# Patient Record
Sex: Male | Born: 1935 | Hispanic: No | Marital: Married | Smoking: Former smoker
Health system: Southern US, Community
[De-identification: ages and names within clinical notes are randomized; demographics above are authoritative.]

## PROBLEM LIST (undated history)

## (undated) DIAGNOSIS — C4490 Unspecified malignant neoplasm of skin, unspecified: Secondary | ICD-10-CM

## (undated) DIAGNOSIS — K635 Polyp of colon: Secondary | ICD-10-CM

## (undated) DIAGNOSIS — I2510 Atherosclerotic heart disease of native coronary artery without angina pectoris: Secondary | ICD-10-CM

## (undated) DIAGNOSIS — I459 Conduction disorder, unspecified: Secondary | ICD-10-CM

## (undated) DIAGNOSIS — Z95828 Presence of other vascular implants and grafts: Secondary | ICD-10-CM

## (undated) DIAGNOSIS — D649 Anemia, unspecified: Secondary | ICD-10-CM

## (undated) DIAGNOSIS — C182 Malignant neoplasm of ascending colon: Principal | ICD-10-CM

## (undated) DIAGNOSIS — M1990 Unspecified osteoarthritis, unspecified site: Secondary | ICD-10-CM

## (undated) DIAGNOSIS — E785 Hyperlipidemia, unspecified: Secondary | ICD-10-CM

## (undated) DIAGNOSIS — E119 Type 2 diabetes mellitus without complications: Secondary | ICD-10-CM

## (undated) DIAGNOSIS — I255 Ischemic cardiomyopathy: Secondary | ICD-10-CM

## (undated) DIAGNOSIS — I219 Acute myocardial infarction, unspecified: Secondary | ICD-10-CM

## (undated) DIAGNOSIS — I513 Intracardiac thrombosis, not elsewhere classified: Secondary | ICD-10-CM

## (undated) HISTORY — DX: Atherosclerotic heart disease of native coronary artery without angina pectoris: I25.10

## (undated) HISTORY — DX: Malignant neoplasm of ascending colon: C18.2

## (undated) HISTORY — PX: JOINT REPLACEMENT: SHX530

## (undated) HISTORY — DX: Hyperlipidemia, unspecified: E78.5

## (undated) HISTORY — PX: PARTIAL COLECTOMY: SHX5273

## (undated) HISTORY — DX: Polyp of colon: K63.5

## (undated) HISTORY — DX: Unspecified malignant neoplasm of skin, unspecified: C44.90

## (undated) HISTORY — DX: Intracardiac thrombosis, not elsewhere classified: I51.3

## (undated) HISTORY — DX: Ischemic cardiomyopathy: I25.5

## (undated) HISTORY — DX: Conduction disorder, unspecified: I45.9

---

## 2007-08-10 ENCOUNTER — Encounter: Admission: RE | Admit: 2007-08-10 | Discharge: 2007-08-10 | Payer: Self-pay | Admitting: Orthopedic Surgery

## 2008-09-29 ENCOUNTER — Encounter: Admission: RE | Admit: 2008-09-29 | Discharge: 2008-09-29 | Payer: Self-pay | Admitting: Internal Medicine

## 2008-11-17 ENCOUNTER — Encounter: Admission: RE | Admit: 2008-11-17 | Discharge: 2008-12-08 | Payer: Self-pay | Admitting: Internal Medicine

## 2008-12-04 ENCOUNTER — Ambulatory Visit: Payer: Self-pay | Admitting: Cardiothoracic Surgery

## 2008-12-04 ENCOUNTER — Inpatient Hospital Stay (HOSPITAL_COMMUNITY): Admission: EM | Admit: 2008-12-04 | Discharge: 2008-12-13 | Payer: Self-pay | Admitting: Emergency Medicine

## 2008-12-04 HISTORY — PX: CARDIAC CATHETERIZATION: SHX172

## 2008-12-08 ENCOUNTER — Encounter (INDEPENDENT_AMBULATORY_CARE_PROVIDER_SITE_OTHER): Payer: Self-pay | Admitting: Cardiovascular Disease

## 2008-12-16 ENCOUNTER — Inpatient Hospital Stay (HOSPITAL_COMMUNITY): Admission: EM | Admit: 2008-12-16 | Discharge: 2008-12-20 | Payer: Self-pay | Admitting: Emergency Medicine

## 2009-01-13 HISTORY — PX: CARDIOVASCULAR STRESS TEST: SHX262

## 2009-03-25 HISTORY — PX: TRANSTHORACIC ECHOCARDIOGRAM: SHX275

## 2010-02-18 ENCOUNTER — Encounter: Admission: RE | Admit: 2010-02-18 | Discharge: 2010-02-18 | Payer: Self-pay | Admitting: Internal Medicine

## 2010-02-19 ENCOUNTER — Inpatient Hospital Stay (HOSPITAL_COMMUNITY): Admission: EM | Admit: 2010-02-19 | Discharge: 2010-02-20 | Payer: Self-pay | Admitting: Cardiovascular Disease

## 2010-08-26 LAB — COMPREHENSIVE METABOLIC PANEL
Albumin: 3.8 g/dL (ref 3.5–5.2)
Alkaline Phosphatase: 51 U/L (ref 39–117)
BUN: 22 mg/dL (ref 6–23)
BUN: 21 mg/dL (ref 6–23)
CO2: 25 mEq/L (ref 19–32)
Calcium: 9 mg/dL (ref 8.4–10.5)
Calcium: 8.7 mg/dL (ref 8.4–10.5)
Creatinine, Ser: 1.01 mg/dL (ref 0.4–1.5)
GFR calc Af Amer: >60 mL/min (ref >60)
GFR calc non Af Amer: >60 mL/min (ref >60)
Glucose, Bld: 134 mg/dL — ABNORMAL HIGH (ref 70–99)
Potassium: 4.1 mEq/L (ref 3.5–5.1)
Total Bilirubin: 1.4 mg/dL — ABNORMAL HIGH (ref 0.3–1.2)
Total Protein: 6.4 g/dL (ref 6.0–8.3)

## 2010-08-26 LAB — CBC
HCT: 44 % (ref 39.0–52.0)
Hemoglobin: 14.2 g/dL (ref 13.0–17.0)
Hemoglobin: 15 g/dL (ref 13.0–17.0)
MCH: 30.7 pg (ref 26.0–34.0)
MCH: 30.8 pg (ref 26.0–34.0)
MCHC: 33.9 g/dL (ref 30.0–36.0)
MCV: 91.5 fL (ref 78.0–100.0)
MCV: 90.2 fL (ref 78.0–100.0)
MCV: 90.5 fL (ref 78.0–100.0)
Platelets: 102 10*3/uL — ABNORMAL LOW (ref 150–400)
Platelets: 123 10*3/uL — ABNORMAL LOW (ref 150–400)
RBC: 4.88 MIL/uL (ref 4.22–5.81)
RDW: 14 % (ref 11.5–15.5)
WBC: 6.7 10*3/uL (ref 4.0–10.5)
WBC: 8.3 10*3/uL (ref 4.0–10.5)

## 2010-08-26 LAB — URINALYSIS, ROUTINE W REFLEX MICROSCOPIC
Glucose, UA: NEGATIVE mg/dL
Protein, ur: NEGATIVE mg/dL
Specific Gravity, Urine: 1.018 (ref 1.005–1.030)
Urobilinogen, UA: 0.2 mg/dL (ref 0.0–1.0)

## 2010-08-26 LAB — DIFFERENTIAL
Basophils Absolute: 0 10*3/uL (ref 0.0–0.1)
Eosinophils Absolute: 0.2 10*3/uL (ref 0.0–0.7)
Eosinophils Relative: 2 % (ref 0–5)
Lymphocytes Relative: 12 % (ref 12–46)
Lymphocytes Relative: 14 % (ref 12–46)
Lymphs Abs: 0.8 10*3/uL (ref 0.7–4.0)
Lymphs Abs: 1.2 10*3/uL (ref 0.7–4.0)
Monocytes Absolute: 0.7 10*3/uL (ref 0.1–1.0)
Monocytes Relative: 9 % (ref 3–12)
Neutro Abs: 5.7 10*3/uL (ref 1.7–7.7)
Neutrophils Relative %: 81 % — ABNORMAL HIGH (ref 43–77)

## 2010-08-26 LAB — TYPE AND SCREEN: Antibody Screen: NEGATIVE

## 2010-08-26 LAB — PROTIME-INR
INR: 2.31 — ABNORMAL HIGH (ref 0.00–1.49)
Prothrombin Time: 24.6 seconds — ABNORMAL HIGH (ref 11.6–15.2)

## 2010-08-26 LAB — GLUCOSE, CAPILLARY

## 2010-08-26 LAB — BASIC METABOLIC PANEL
Chloride: 108 mEq/L (ref 96–112)
GFR calc Af Amer: >60 mL/min (ref >60)
Potassium: 4.5 mEq/L (ref 3.5–5.1)
Sodium: 138 mEq/L (ref 135–145)

## 2010-08-26 LAB — BRAIN NATRIURETIC PEPTIDE
Pro B Natriuretic peptide (BNP): 271 pg/mL — ABNORMAL HIGH (ref 0.0–100.0)
Pro B Natriuretic peptide (BNP): 315 pg/mL — ABNORMAL HIGH (ref 0.0–100.0)

## 2010-08-26 LAB — POCT CARDIAC MARKERS: CKMB, poc: 2.6 ng/mL (ref 1.0–8.0)

## 2010-09-19 LAB — PROTIME-INR
INR: 1.2 (ref 0.00–1.49)
INR: 2.6 — ABNORMAL HIGH (ref 0.00–1.49)
INR: 2.7 — ABNORMAL HIGH (ref 0.00–1.49)
Prothrombin Time: 18.5 seconds — ABNORMAL HIGH (ref 11.6–15.2)
Prothrombin Time: 25.1 seconds — ABNORMAL HIGH (ref 11.6–15.2)
Prothrombin Time: 30 seconds — ABNORMAL HIGH (ref 11.6–15.2)
Prothrombin Time: 30.3 seconds — ABNORMAL HIGH (ref 11.6–15.2)
Prothrombin Time: 35.5 seconds — ABNORMAL HIGH (ref 11.6–15.2)

## 2010-09-19 LAB — BRAIN NATRIURETIC PEPTIDE
Pro B Natriuretic peptide (BNP): 1003 pg/mL — ABNORMAL HIGH (ref 0.0–100.0)
Pro B Natriuretic peptide (BNP): 1154 pg/mL — ABNORMAL HIGH (ref 0.0–100.0)
Pro B Natriuretic peptide (BNP): 677 pg/mL — ABNORMAL HIGH (ref 0.0–100.0)

## 2010-09-19 LAB — CBC
HCT: 36.5 % — ABNORMAL LOW (ref 39.0–52.0)
HCT: 37.3 % — ABNORMAL LOW (ref 39.0–52.0)
HCT: 38.6 % — ABNORMAL LOW (ref 39.0–52.0)
HCT: 38.9 % — ABNORMAL LOW (ref 39.0–52.0)
Hemoglobin: 12.5 g/dL — ABNORMAL LOW (ref 13.0–17.0)
Hemoglobin: 12.9 g/dL — ABNORMAL LOW (ref 13.0–17.0)
Hemoglobin: 13.3 g/dL (ref 13.0–17.0)
MCHC: 34.2 g/dL (ref 30.0–36.0)
MCHC: 34.2 g/dL (ref 30.0–36.0)
MCHC: 34.3 g/dL (ref 30.0–36.0)
MCV: 90.2 fL (ref 78.0–100.0)
MCV: 90.3 fL (ref 78.0–100.0)
Platelets: 142 10*3/uL — ABNORMAL LOW (ref 150–400)
Platelets: 247 10*3/uL (ref 150–400)
RBC: 4.13 MIL/uL — ABNORMAL LOW (ref 4.22–5.81)
RBC: 4.27 MIL/uL (ref 4.22–5.81)
RBC: 4.33 MIL/uL (ref 4.22–5.81)
RBC: 4.34 MIL/uL (ref 4.22–5.81)
RBC: 4.35 MIL/uL (ref 4.22–5.81)
RDW: 13.7 % (ref 11.5–15.5)
WBC: 8 10*3/uL (ref 4.0–10.5)
WBC: 9 10*3/uL (ref 4.0–10.5)
WBC: 9.3 10*3/uL (ref 4.0–10.5)

## 2010-09-19 LAB — COMPREHENSIVE METABOLIC PANEL
AST: 25 U/L (ref 0–37)
Albumin: 3.2 g/dL — ABNORMAL LOW (ref 3.5–5.2)
Alkaline Phosphatase: 75 U/L (ref 39–117)
Chloride: 105 mEq/L (ref 96–112)
GFR calc Af Amer: >60 mL/min (ref >60)
Potassium: 4.3 mEq/L (ref 3.5–5.1)
Sodium: 138 mEq/L (ref 135–145)
Total Bilirubin: 0.9 mg/dL (ref 0.3–1.2)
Total Protein: 6.7 g/dL (ref 6.0–8.3)

## 2010-09-19 LAB — URINALYSIS, ROUTINE W REFLEX MICROSCOPIC
Glucose, UA: NEGATIVE mg/dL
Ketones, ur: NEGATIVE mg/dL
Nitrite: NEGATIVE
Specific Gravity, Urine: 1.01 (ref 1.005–1.030)
pH: 7 (ref 5.0–8.0)

## 2010-09-19 LAB — CARDIAC PANEL(CRET KIN+CKTOT+MB+TROPI)
CK, MB: 2.6 ng/mL (ref 0.3–4.0)
Relative Index: 2 (ref 0.0–2.5)
Total CK: 130 U/L (ref 7–232)
Total CK: 139 U/L (ref 7–232)
Troponin I: 0.3 ng/mL — ABNORMAL HIGH (ref 0.00–0.06)

## 2010-09-19 LAB — HEPARIN LEVEL (UNFRACTIONATED)
Heparin Unfractionated: 0.43 IU/mL (ref 0.30–0.70)
Heparin Unfractionated: 0.75 IU/mL — ABNORMAL HIGH (ref 0.30–0.70)

## 2010-09-19 LAB — GLUCOSE, CAPILLARY

## 2010-09-19 LAB — BASIC METABOLIC PANEL
BUN: 18 mg/dL (ref 6–23)
BUN: 19 mg/dL (ref 6–23)
BUN: 20 mg/dL (ref 6–23)
BUN: 21 mg/dL (ref 6–23)
BUN: 21 mg/dL (ref 6–23)
CO2: 26 mEq/L (ref 19–32)
CO2: 27 mEq/L (ref 19–32)
CO2: 28 mEq/L (ref 19–32)
CO2: 28 mEq/L (ref 19–32)
Calcium: 8.8 mg/dL (ref 8.4–10.5)
Calcium: 8.9 mg/dL (ref 8.4–10.5)
Calcium: 9 mg/dL (ref 8.4–10.5)
Chloride: 103 mEq/L (ref 96–112)
Chloride: 103 mEq/L (ref 96–112)
Creatinine, Ser: 0.99 mg/dL (ref 0.4–1.5)
Creatinine, Ser: 1.04 mg/dL (ref 0.4–1.5)
Creatinine, Ser: 1.07 mg/dL (ref 0.4–1.5)
Creatinine, Ser: 1.21 mg/dL (ref 0.4–1.5)
GFR calc Af Amer: >60 mL/min (ref >60)
GFR calc Af Amer: >60 mL/min (ref >60)
GFR calc non Af Amer: 58 mL/min — ABNORMAL LOW (ref >60)
GFR calc non Af Amer: >60 mL/min (ref >60)
GFR calc non Af Amer: >60 mL/min (ref >60)
GFR calc non Af Amer: >60 mL/min (ref >60)
Glucose, Bld: 100 mg/dL — ABNORMAL HIGH (ref 70–99)
Glucose, Bld: 105 mg/dL — ABNORMAL HIGH (ref 70–99)
Glucose, Bld: 111 mg/dL — ABNORMAL HIGH (ref 70–99)
Glucose, Bld: 94 mg/dL (ref 70–99)
Potassium: 3.6 mEq/L (ref 3.5–5.1)
Potassium: 3.9 mEq/L (ref 3.5–5.1)
Potassium: 4 mEq/L (ref 3.5–5.1)
Potassium: 4.2 mEq/L (ref 3.5–5.1)
Sodium: 139 mEq/L (ref 135–145)
Sodium: 140 mEq/L (ref 135–145)
Sodium: 142 mEq/L (ref 135–145)

## 2010-09-19 LAB — URINE CULTURE: Colony Count: 10000

## 2010-09-19 LAB — CULTURE, BLOOD (ROUTINE X 2)

## 2010-09-19 LAB — DIFFERENTIAL
Basophils Absolute: 0 10*3/uL (ref 0.0–0.1)
Basophils Relative: 1 % (ref 0–1)
Eosinophils Relative: 4 % (ref 0–5)
Monocytes Absolute: 0.7 10*3/uL (ref 0.1–1.0)
Monocytes Relative: 8 % (ref 3–12)

## 2010-09-19 LAB — APTT: aPTT: 42 seconds — ABNORMAL HIGH (ref 24–37)

## 2010-09-19 LAB — CK TOTAL AND CKMB (NOT AT ARMC): Relative Index: 1.8 (ref 0.0–2.5)

## 2010-09-20 LAB — GLUCOSE, CAPILLARY
Glucose-Capillary: 101 mg/dL — ABNORMAL HIGH (ref 70–99)
Glucose-Capillary: 103 mg/dL — ABNORMAL HIGH (ref 70–99)
Glucose-Capillary: 107 mg/dL — ABNORMAL HIGH (ref 70–99)
Glucose-Capillary: 115 mg/dL — ABNORMAL HIGH (ref 70–99)
Glucose-Capillary: 123 mg/dL — ABNORMAL HIGH (ref 70–99)
Glucose-Capillary: 124 mg/dL — ABNORMAL HIGH (ref 70–99)
Glucose-Capillary: 127 mg/dL — ABNORMAL HIGH (ref 70–99)
Glucose-Capillary: 133 mg/dL — ABNORMAL HIGH (ref 70–99)
Glucose-Capillary: 135 mg/dL — ABNORMAL HIGH (ref 70–99)
Glucose-Capillary: 148 mg/dL — ABNORMAL HIGH (ref 70–99)
Glucose-Capillary: 151 mg/dL — ABNORMAL HIGH (ref 70–99)
Glucose-Capillary: 155 mg/dL — ABNORMAL HIGH (ref 70–99)
Glucose-Capillary: 157 mg/dL — ABNORMAL HIGH (ref 70–99)
Glucose-Capillary: 177 mg/dL — ABNORMAL HIGH (ref 70–99)
Glucose-Capillary: 200 mg/dL — ABNORMAL HIGH (ref 70–99)
Glucose-Capillary: 93 mg/dL (ref 70–99)
Glucose-Capillary: 94 mg/dL (ref 70–99)
Glucose-Capillary: 97 mg/dL (ref 70–99)

## 2010-09-20 LAB — HEPARIN LEVEL (UNFRACTIONATED)
Heparin Unfractionated: 0.12 IU/mL — ABNORMAL LOW (ref 0.30–0.70)
Heparin Unfractionated: 0.3 IU/mL (ref 0.30–0.70)

## 2010-09-20 LAB — BASIC METABOLIC PANEL
BUN: 25 mg/dL — ABNORMAL HIGH (ref 6–23)
BUN: 27 mg/dL — ABNORMAL HIGH (ref 6–23)
BUN: 27 mg/dL — ABNORMAL HIGH (ref 6–23)
BUN: 32 mg/dL — ABNORMAL HIGH (ref 6–23)
CO2: 26 mEq/L (ref 19–32)
Calcium: 8.2 mg/dL — ABNORMAL LOW (ref 8.4–10.5)
Calcium: 8.4 mg/dL (ref 8.4–10.5)
Chloride: 106 mEq/L (ref 96–112)
Chloride: 110 mEq/L (ref 96–112)
Chloride: 110 mEq/L (ref 96–112)
Creatinine, Ser: 0.94 mg/dL (ref 0.4–1.5)
Creatinine, Ser: 0.94 mg/dL (ref 0.4–1.5)
Creatinine, Ser: 1.03 mg/dL (ref 0.4–1.5)
GFR calc Af Amer: >60 mL/min (ref >60)
GFR calc non Af Amer: 56 mL/min — ABNORMAL LOW (ref >60)
GFR calc non Af Amer: >60 mL/min (ref >60)
GFR calc non Af Amer: >60 mL/min (ref >60)
GFR calc non Af Amer: >60 mL/min (ref >60)
Glucose, Bld: 157 mg/dL — ABNORMAL HIGH (ref 70–99)
Glucose, Bld: 179 mg/dL — ABNORMAL HIGH (ref 70–99)
Potassium: 3.8 mEq/L (ref 3.5–5.1)
Sodium: 141 mEq/L (ref 135–145)

## 2010-09-20 LAB — CARDIAC PANEL(CRET KIN+CKTOT+MB+TROPI)
CK, MB: 581.9 ng/mL — ABNORMAL HIGH (ref 0.3–4.0)
Relative Index: 8.6 — ABNORMAL HIGH (ref 0.0–2.5)
Total CK: 5679 U/L — ABNORMAL HIGH (ref 7–232)
Troponin I: >100 ng/mL (ref 0.00–0.06)

## 2010-09-20 LAB — PROTIME-INR: Prothrombin Time: 14.9 seconds (ref 11.6–15.2)

## 2010-09-20 LAB — URINE CULTURE

## 2010-09-20 LAB — CBC
HCT: 41.7 % (ref 39.0–52.0)
HCT: 44.7 % (ref 39.0–52.0)
Hemoglobin: 13.6 g/dL (ref 13.0–17.0)
Hemoglobin: 14.9 g/dL (ref 13.0–17.0)
MCHC: 32.7 g/dL (ref 30.0–36.0)
MCHC: 33.3 g/dL (ref 30.0–36.0)
MCHC: 33.8 g/dL (ref 30.0–36.0)
MCHC: 34.5 g/dL (ref 30.0–36.0)
MCV: 89.9 fL (ref 78.0–100.0)
MCV: 90.7 fL (ref 78.0–100.0)
MCV: 91.3 fL (ref 78.0–100.0)
Platelets: 144 10*3/uL — ABNORMAL LOW (ref 150–400)
Platelets: 145 10*3/uL — ABNORMAL LOW (ref 150–400)
Platelets: 97 10*3/uL — ABNORMAL LOW (ref 150–400)
RBC: 3.96 MIL/uL — ABNORMAL LOW (ref 4.22–5.81)
RBC: 4.05 MIL/uL — ABNORMAL LOW (ref 4.22–5.81)
RBC: 4.1 MIL/uL — ABNORMAL LOW (ref 4.22–5.81)
RBC: 4.93 MIL/uL (ref 4.22–5.81)
RDW: 13.6 % (ref 11.5–15.5)
RDW: 14 % (ref 11.5–15.5)
RDW: 14.1 % (ref 11.5–15.5)
RDW: 14.2 % (ref 11.5–15.5)
WBC: 7.8 10*3/uL (ref 4.0–10.5)
WBC: 8.9 10*3/uL (ref 4.0–10.5)
WBC: 9.4 10*3/uL (ref 4.0–10.5)

## 2010-09-20 LAB — COMPREHENSIVE METABOLIC PANEL
ALT: 72 U/L — ABNORMAL HIGH (ref 0–53)
ALT: 91 U/L — ABNORMAL HIGH (ref 0–53)
AST: 150 U/L — ABNORMAL HIGH (ref 0–37)
Albumin: 3 g/dL — ABNORMAL LOW (ref 3.5–5.2)
Alkaline Phosphatase: 71 U/L (ref 39–117)
BUN: 29 mg/dL — ABNORMAL HIGH (ref 6–23)
CO2: 26 mEq/L (ref 19–32)
Calcium: 8.4 mg/dL (ref 8.4–10.5)
Chloride: 101 mEq/L (ref 96–112)
Chloride: 110 mEq/L (ref 96–112)
Creatinine, Ser: 0.98 mg/dL (ref 0.4–1.5)
GFR calc Af Amer: >60 mL/min (ref >60)
GFR calc non Af Amer: >60 mL/min (ref >60)
Glucose, Bld: 125 mg/dL — ABNORMAL HIGH (ref 70–99)
Glucose, Bld: 156 mg/dL — ABNORMAL HIGH (ref 70–99)
Potassium: 3.9 mEq/L (ref 3.5–5.1)
Sodium: 137 mEq/L (ref 135–145)
Sodium: 141 mEq/L (ref 135–145)
Total Bilirubin: 1.1 mg/dL (ref 0.3–1.2)
Total Bilirubin: 1.7 mg/dL — ABNORMAL HIGH (ref 0.3–1.2)
Total Protein: 5.6 g/dL — ABNORMAL LOW (ref 6.0–8.3)
Total Protein: 6.3 g/dL (ref 6.0–8.3)

## 2010-09-20 LAB — TSH: TSH: 1.145 u[IU]/mL (ref 0.350–4.500)

## 2010-09-20 LAB — CK TOTAL AND CKMB (NOT AT ARMC)
CK, MB: 267.6 ng/mL — ABNORMAL HIGH (ref 0.3–4.0)
Relative Index: 4.9 — ABNORMAL HIGH (ref 0.0–2.5)
Relative Index: 5.5 — ABNORMAL HIGH (ref 0.0–2.5)

## 2010-09-20 LAB — URINALYSIS, MICROSCOPIC ONLY
Bilirubin Urine: NEGATIVE
Hgb urine dipstick: NEGATIVE
Nitrite: NEGATIVE
Specific Gravity, Urine: 1.012 (ref 1.005–1.030)
pH: 6 (ref 5.0–8.0)

## 2010-09-20 LAB — MAGNESIUM
Magnesium: 2.1 mg/dL (ref 1.5–2.5)
Magnesium: 2.2 mg/dL (ref 1.5–2.5)

## 2010-09-20 LAB — DIFFERENTIAL
Basophils Relative: 0 % (ref 0–1)
Eosinophils Absolute: 0.3 10*3/uL (ref 0.0–0.7)
Eosinophils Relative: 4 % (ref 0–5)
Monocytes Absolute: 0.5 10*3/uL (ref 0.1–1.0)
Monocytes Relative: 8 % (ref 3–12)
Neutro Abs: 4 10*3/uL (ref 1.7–7.7)

## 2010-09-20 LAB — POCT I-STAT, CHEM 8
BUN: 32 mg/dL — ABNORMAL HIGH (ref 6–23)
Calcium, Ion: 1.12 mmol/L (ref 1.12–1.32)
Chloride: 112 mEq/L (ref 96–112)
Creatinine, Ser: 1.1 mg/dL (ref 0.4–1.5)

## 2010-09-20 LAB — POCT CARDIAC MARKERS: Troponin i, poc: <0.05 ng/mL (ref 0.00–0.09)

## 2010-09-20 LAB — APTT: aPTT: 152 seconds — ABNORMAL HIGH (ref 24–37)

## 2010-09-20 LAB — LIPID PANEL
Cholesterol: 224 mg/dL — ABNORMAL HIGH (ref 0–200)
Total CHOL/HDL Ratio: 5.9 RATIO
VLDL: 18 mg/dL (ref 0–40)

## 2010-09-20 LAB — BRAIN NATRIURETIC PEPTIDE: Pro B Natriuretic peptide (BNP): 395 pg/mL — ABNORMAL HIGH (ref 0.0–100.0)

## 2010-10-26 NOTE — H&P (Signed)
NAME:  Blake Patterson, Blake Patterson NO.:  [id number]   MEDICAL RECORD NO.:  [id number]          PATIENT TYPE:  INP   LOCATION:  2905                         FACILITY:  [facility]   PHYSICIAN:  Cristy Hilts. Jacinto Halim, MD       DATE OF BIRTH:  December 31, 1926   DATE OF ADMISSION:  12/04/2008  DATE OF DISCHARGE:                              HISTORY & PHYSICAL   CHIEF COMPLAINT:  Chest pain.   HISTORY OF PRESENT ILLNESS:  An 77 year old white male, very active,  does not appear to be his stated age of 73. developed chest heaviness on  the afternoon of December 04, 2008, and it resolved spontaneously.  The pain  reoccurred at 6:00 p.m. on December 04, 2008.  He also had an episode of  syncope, a tightness-type pain.  EMS was called.  They gave him  nitroglycerin that gave him partial relief.  The pain did not radiate.  It was associated with diaphoresis and nausea.  On his EKG, he had acute  ST elevation in leads V1-V6 and AVL with reciprocal ST depression.  He  was taken emergently to the cardiac catheterization lab, still with  chest pain, diaphoretic and pale.   PAST MEDICAL HISTORY:  History of back pain, degenerative joint disease.  No past surgical history.  No history of coronary disease or diabetes.   OUTPATIENT MEDICATIONS:  Only consisted of vitamin D, vitamin E and  Aleve - he takes 2 daily for his arthritis.   ALLERGIES:  No known allergies.   SOCIAL HISTORY:  He is married and active.  Does not use tobacco.  No  alcohol use.   FAMILY HISTORY:  Not significant with patient's age and current status.   REVIEW OF SYSTEMS:  Had been in usual state of health.  GENERAL:  No  colds or fevers.  SKIN:  No rashes.  HEENT:  No complaints.  CARDIOVASCULAR:  No chest pain until June 24.  GASTROINTESTINAL:  No  diarrhea, constipation or melena.  GENITOURINARY:  Negative.  MUSCULOSKELETAL:  Positive for back pain and arthritic pain.  NEUROLOGICAL:  Syncope prior to the EMS arrival but none prior.  ENDOCRINE:  No diabetes or thyroid disease.   PHYSICAL EXAMINATION:  Blood pressure on arrival to the ER 89/53, pulse  51, respiratory rate 18, temperature 97.6.  Oxygen saturation with  oxygen at 2 liters 99%.  On exam, awake and oriented but anxious and diaphoretic.  SKIN:  Diaphoretic.  NECK:  Supple.  No JVD, no bruits.  LUNGS:  Faint crackles bilaterally.  HEART:  Sounds S1-S2 distant.  No gallop or murmur.  HEENT:  Normocephalic.  Sclera clear.  ABDOMEN:  Soft, nontender, positive bowel sounds.  LOWER EXTREMITIES:  Without edema.  Pedal pulses are present but weak.   IMPRESSION:  1. Acute anterior wall myocardial infarction with ST elevation.  2. Cardiogenic shock with pulmonary edema.  3. Hypotension.  4. Bradycardia.   PLAN:  He was taking emergently to the cardiac catheterization lab,  undergoing cardiac catheterization and found to have significant LAD  disease of 100% as well  as 80% of the circumflex.  Once catheterization  is completed, the patient will proceed to the CCU unit.      Darcella Gasman. [place], N.P.      Cristy Hilts. Jacinto Halim, MD     LRI/MEDQ  D:  12/05/2008  T:  12/05/2008  Job:  [id number]   cc:   Cristy Hilts. Jacinto Halim, MD  Massie Maroon, MD

## 2010-10-26 NOTE — H&P (Signed)
NAME:  Blake Patterson, Blake Patterson NO.:  [id number]   MEDICAL RECORD NO.:  [id number]          PATIENT TYPE:  INP   LOCATION:  1825                         FACILITY:  [facility]   PHYSICIAN:  Nanetta Batty, M.D.   DATE OF BIRTH:  1926/09/29   DATE OF ADMISSION:  12/16/2008  DATE OF DISCHARGE:                              HISTORY & PHYSICAL   CHIEF COMPLAINT:  Chest pain, productive cough, low blood pressure.   HISTORY OF PRESENT ILLNESS:  An 77 year old white married male just  discharged from [facility] on December 13, 2008, after having a large  anterior wall MI on December 04, 2008, undergoing PTCA and stent to the LAD  that was 100% occluded.  The patient was in cardiogenic shock and went  on balloon pump.  After slow progression, the patient recovered and by  December 13, 2008, was ready for discharge.  At time of discharge, he had  mild lower extremity edema.  He was continued on Lasix twice a day for 2  days then stopped.  He had no chest pain and was quite anxious to go  home and Home Healthcare was to follow-up.  Today, Home Healthcare did  come to see the patient.  They called our office, his blood pressure was  90 and he had an episode of chest pain this morning, relieved with  nitroglycerin, but then he stated the pain was gone but the pressure  continued.  Because of these issues, his continued worsening lower  extremity edema and his blood pressure of 90 systolic, he was asked to  come to the emergency room for further evaluation.  Here in the  emergency room, he does admit to chest pain the day prior to admission  as well, again relieved with nitroglycerin.  Today, it was somewhat  worse.  He also had vomiting this morning prior to his chest pain.  His  wife relates he is very anxious and that he is up walking around  frequently, cannot sit still.  The patient was very active prior to his  MI in June.  Also, he does have a productive cough; he is not sure what  color, but  that has started in the last 24 hours.   PAST MEDICAL HISTORY:  Essentially he was a healthy 77 year old  gentleman until he had his anterior MI, except for back pain,  degenerative joint disease.  He never had any surgical history and no  coronary history or diabetes prior to the recent admission.  He had been  on no previous outpatient meds except for nonsteroidals and vitamins.   CURRENT MEDICATIONS:  1. Inspra 12.5 mg daily.  The patient has not taken this as an      outpatient as his pharmacy did not have.  2. Effient 5 mg 1 daily.  He has taken 10 mg daily as the pharmacy did      not have 5 mg tablet and we only had samples of 10 mg tablets, so      he has taken 10 mg daily.  3. Baby aspirin 81 mg two  daily.  4. Crestor 20 mg daily.  5. Pepcid 40 mg daily.  6. Lisinopril 5 mg daily.  7. Coreg 3.125 mg twice a day.  8. Stool softener, Colace over-the-counter for his bowels and MiraLax      as needed.  9. Lasix 20 mg twice a day for 2 days and then he was to go to just 20      mg daily.  10.K-Dur 10 mEq 1 daily for 2 days, then 1/2 tab daily.  11.Coumadin 5 mg tablets.  He did not take any on December 13, 2008,      because his INR was elevated.  Per pharmacy, he was to take 5 mg on      December 14, 2008 and 5 mg on December 15, 2008, and he had an INR checked      today that he was told was therapeutic.  12.Nitroglycerin 1/150 as needed for chest pain.  13.Vitamin D as before.  He was instructed not to take Aleve,      ibuprofen or vitamin E on his numerous medications.   FAMILY HISTORY:  Not significant to the patient's age and current  status.   SOCIAL HISTORY:  He is married and has several children or at least his  son is here today.  He was very active prior to his MI.  No tobacco use.  No alcohol use.   ALLERGIES:  NO KNOWN ALLERGIES.   REVIEW OF SYSTEMS:  GENERAL:  Somewhat anxious, just cannot sit still  very long, up and moving too frequently per his wife.  GI:  Some  diarrhea yesterday.  Today, he feels more constipated.  GU:  No hematuria or dysuria.  NEURO:  No syncope.  MUSCULOSKELETAL:  Lower extremity edema.  PULMONARY:  No shortness of breath.  CARDIOVASCULAR:  As stated above.  ENDOCRINE:  No diabetes or thyroid disease.   PHYSICAL EXAMINATION:  VITAL SIGNS:  Today, blood pressure 109/63, pulse  60, respirations 24, temperature 100.  Oxygen saturation on room air  96%.  GENERAL:  Alert and oriented, somewhat slow to answer questions at  times, but pleasant affect.  HEENT:  Normocephalic.  Sclerae clear.  NECK:  Supple.  No JVD, no bruits.  LUNGS:  Clear without rales, rhonchi or wheezes.  HEART:  S1-S2.  Regular rate and rhythm without murmur, gallop, rub or  click.  ABDOMEN:  Soft, nontender.  Positive bowel sounds x4.  I do not palpate  liver, spleen or masses.  LOWER EXTREMITIES:  He has 2+ at his ankle.  Above there, maybe 1+ to trace edema.  SKIN:  Warm and dry.  Brisk capillary refill.  He had multiple areas of  ecchymosis secondary to IV sticks and lab draws during hospitalization.  NEURO:  Alert and oriented x3.  Follows commands easily.  Moves all  extremities.   LABORATORY DATA:  Pending.   X-RAYS:  Pending.  Also, a KUB was ordered secondary to constipation  versus diarrhea, questionable impaction.   IMPRESSION:  1. Unstable angina, rule out cardiac versus gastrointestinal origin.  2. Fever with productive cough.  3. Diarrhea/constipation.  4. Borderline blood pressure.  5. Anxiety.  6. Known coronary artery disease with recent acute anterior myocardial      infarction, stent to the left anterior descending that was 100%      occluded and cardiogenic shock with intraaortic balloon pump.   PLAN:  Dr. Allyson Sabal saw him and assessed him with me.  Due to  the patient's  age and severity of his recent illness, we will admit him at least  overnight to evaluate, place him on a sleeping pill which he requested,  check urine  cultures, blood cultures, as well as chest x-ray and KUB of  his abdomen.  Add Ativan to his medical regimen and Avelox  once his cultures are done.  Continue his Coumadin for now unless other  problems arise.  His EKG looks as if limb reversal was done in that he  has Q-wave in lead I that was not there previously and deep T-wave.  Otherwise EKG is essentially unchanged.  He is somewhat bradycardic in  the 50s.      Darcella Gasman. Annie Paras, N.P.      Nanetta Batty, M.D.  Electronically Signed    LRI/MEDQ  D:  12/16/2008  T:  12/16/2008  Job:  [id number]   cc:   Cristy Hilts. Jacinto Halim, MD  Massie Maroon, MD  Nanetta Batty, M.D.

## 2010-10-26 NOTE — Cardiovascular Report (Signed)
NAME:  Blake Patterson, Blake Patterson NO.:  [id number]   MEDICAL RECORD NO.:  [id number]          PATIENT TYPE:  INP   LOCATION:  2905                         FACILITY:  [facility]   PHYSICIAN:  Cristy Hilts. Jacinto Halim, MD       DATE OF BIRTH:  [birth year]   DATE OF PROCEDURE:  12/04/2008  DATE OF DISCHARGE:                            CARDIAC CATHETERIZATION   PROCEDURE PERFORMED:  Emergent left heart catheterization and  angioplasty to the proximal and mid segment of the LAD, and balloon  angioplasty to the diagonal branch of the LAD for acute extensive  anterolateral wall myocardial infarction and near cardiogenic shock.   HISTORY:  Blake Patterson is an 77 year old relatively healthy gentleman  with no known prior medical illnesses who had an episode of chest pain  that resolved spontaneously this afternoon and again had recurrence of  chest pain at around 6 o'clock and had an episode of syncope where EMS  was activated.  He eventually arrived at [facility] Emergency Room  around 6:47 p.m. and STEMI was activated showing extensive anterolateral  wall myocardial infarction with ST elevation in I, aVL, V1-V6 with ST  depression in II, III, aVF.   HEMODYNAMIC DATA:  The end-diastolic pressure of the left ventricle was  38 mmHg.   ANGIOGRAPHIC DATA:  Left ventricle:  The ventricle revealed an ejection  fraction of 25% with proximal mid and mid-to-distal, anterolateral,  apical and inferoapical akinesis.  No significant mitral regurgitation.   Right coronary:  Right coronary is a large-caliber vessel and a dominant  vessel with a mid 30% stenoses.   Left main coronary artery:  Left main coronary artery is a large-caliber  vessel.  Distal left main has a 20-30% stenosis.   LAD:  LAD is flush occlude in the proximal segment.  It is a very large  caliber vessel giving origin to a large diagonal 1.  There is mild  diffuse luminal irregularity in the LAD mild diffuse scattered luminal  irregularity.   Ramus intermediate:  Ramus intermediate is a moderate-caliber vessel,  smooth with ostial 10-20% stenosis.   Circumflex coronary artery:  Circumflex coronary artery is a moderate-to-  large caliber vessel with a mid 80% stenoses.   INTERVENTION DATA:  Successful PTCA and stenting of the proximal LAD  with implantation of a long non-drug-eluting 3.5 x 38-mm ZETA stent  deployed at 8 atmospheric pressure.  Following deployment of the stent  there was an edge dissection distally which was covered with a 3.0 x 12-  mm VISION deployed at 10 atmospheric pressure and the same stent balloon  was gently pulled between the two stent struts, and a 16 atmospheric  pressure balloon inflation was performed.   The diagonal branch was also rescued which was completely occluded prior  to balloon angioplasty.  A 2.5 x 12-mm Sprinter [place] balloon was utilized  and balloon angioplasty was performed at 10 atmospheric pressure.  The  stenosis was reduced from 100% to less than 30% with mild ostial  haziness with brisk TIMI 3 flow.   The proximal segment of the LAD  stent was postdilated at high pressure  with a 3.5 x 12 mm [place] Quantum balloon at 16 atmospheric pressure for  about 30 seconds.  Overall stenosis was reduced from 100% occlusion to  0% with brisk TIMI 0 to TIMI 3 flow at the end of the procedure.  TIMI 3  flow was also established into the diagonal 1 branch of the LAD.   RECOMMENDATIONS:  Given extensive anterolateral wall myocardial  infarction and a markedly elevated left ventricle end-diastolic  pressure, intra-aortic balloon pump was introduced through the femoral  arterial access site.   Patient tolerated the procedure well.  At the end of the procedure, the  patient was completely asymptomatic.  The EKG had completely normalized  after balloon angioplasty.  The patient did have episodes of  idioventricular rhythm without any hemodynamic compromise.   RECOMMENDATIONS:  He  has been loaded on ReoPro which we continued for 12  hours and a faint load of 60 mg and then 5 mg once a day along with  aspirin.  Given his severe LV systolic dysfunction, Inspra 25 mg once a  day, Coreg 3.125 mg p.o. b.i.d. and lisinopril 5 mg p.o. daily will be  started.  His BMP will be closely followed.   TECHNIQUE OF THE PROCEDURE:  Under usual sterile precautions using a 6-  French right femoral arterial access and a 5-French right femoral venous  access, a 6-0 multipurpose B2 catheter was advanced into the ascending  aorta and left ventriculography was performed in the RAO projection.  Catheter pulled into the ascending aorta.  Right coronary was  selectively engaged, and angiography was performed, then left main  coronary was selectively engaged and angiography was performed.   Using ReoPro and heparin for anticoagulation maintaining ACT greater  than 200, an [employer] wire was advanced.  Initially, I thought I  was in the LAD and a 2.5 mm x 12-mm [place] balloon was dilated into the  ostium of the ramus intermediate.  The LAD was flush occluded.  Following the angioplasty, I was able to see the ostium of the LAD.  Then I advanced a second wire that is a Cougar wire into the LAD.  The  wire prolapsed into the diagonal branch and I used a 3.0 x 20-mm apex  balloon for performing balloon angioplasty at around 10-12 atmospheric  pressures for 30-40 seconds and angiography was repeated.  Intracoronary  adenosine were administered at multiple episodes.  Intracoronary  nitroglycerin was also administered.  Having performed this, I did  obtain a CVTS consult on emergent basis, Dr. Sheliah Plane, was  present in-house, graciously reviewed the angiogram and given his  advanced age and markedly elevated EDP and markedly depressed ejection  fraction, we opted to proceed with percutaneous revascularization.   We stented the entire lesion length with a 3.5 x 38-mm ZETA stent   deployed at 8 atmospheric pressure and edge dissection was covered with  a 3.0 x 12-mm VISION at around 10 atmospheric pressure and the same  stent balloon was utilized, gently pulling it inside the stent to around  16 atmospheric pressure.  Having performed this then, I went up to the  diagonal branch and the ramus intermediate branch, wire was withdrawn  and the [place] wire was advanced in the diagonal 1 branch of the LAD.  The diagonal branch of the LAD was always protected by having the wire  across this, even during stenting and I was able to easily take the wire  out through the stent struts and reintroduced back into the diagonal  branch.  A 2.5 x 12-mm Sprinter [place] balloon was utilized to perform  multiple balloon angioplasty to the ostium of the diagonal 1 branch of  the LAD and I established TIMI 3 flow.  Having performed this, the wires  were withdrawn and angiography was repeated.  Excellent results were  noted.   I then pulled guide catheter out of the body over a J-wire and inserted  a intra-aortic balloon pump.  The venous sheath and arterial sheaths  were sutured in place and the patient was transferred to CCU in a stable  condition.      Cristy Hilts. Jacinto Halim, MD     JRG/MEDQ  D:  12/04/2008  T:  12/05/2008  Job:  [id number]   cc:   Massie Maroon, MD

## 2010-10-26 NOTE — Discharge Summary (Signed)
NAME:  Blake Patterson, CUEVA NO.:  [id number]   MEDICAL RECORD NO.:  [id number]          PATIENT TYPE:  INP   LOCATION:  2027                         FACILITY:  [facility]   PHYSICIAN:  Cristy Hilts. Jacinto Halim, MD       DATE OF BIRTH:  10-31-1926   DATE OF ADMISSION:  12/04/2008  DATE OF DISCHARGE:  12/13/2008                               DISCHARGE SUMMARY   DISCHARGE DIAGNOSIS:  1. ST elevation myocardial infarction, anterior wall.      a.     Emergent cardiac catheterization with PTCA and stent       deployment to his LAD.  Percutaneous intervention of the diagonal       one angioplasty reducing 100% stenosis to 30.  2. Cardiogenic shock requiring inner aortic balloon pump now resolved.  3. Ischemic cardiomyopathy with ejection fraction 25% by cardiac      catheterization.      a.     A 2-D echocardiogram later in the hospitalization, ejection       fraction 40%.  4. Residual coronary disease of the circumflex of 80%, 30% right      coronary artery stenosis, and 20-30% left main stenosis.  5. Nonsustained ventricular tachycardia, post stenting.  Amiodarone      was used, and then we were able to stop.  6. Fever with clear urinalysis and clear chest x-ray.  7. Hypokalemia, replaced several times.  8. Some confusion post initial acute illness, cleared by discharge.  9. Debilitation.  Worked with cardiac rehab and physical therapy with      resolution of severe dysmobility but still weak at discharge.  10.Constipation, resolved at discharge.  11.Left ventricular thrombus by 2-D echocardiogram with      heparin/Coumadin crossover.  12.Congestive heart failure, improved during hospitalization.  At time      of discharge, BNP 847, which was decreased.  13.Anticoagulation with INR 2.1 at discharge.   DISCHARGE CONDITION:  Much improved.   DISCHARGE MEDICATIONS:  1. __________ 5 mg 1 daily.  Do not stop.  For stent.  2. Baby aspirin 81 mg 2 daily, for stent.  3. Crestor 20 gm 1  daily.  4. Pepcid 40 mg 1 daily.  5. Lisinopril 5 mg daily.  6. Coreg 3.125 mg twice a day.  7. Colace for bowels, over-the-counter, and you can use MiraLax in      liquid daily as needed also.  8. Furosemide 20 mg in a.m. and at suppertime for 2 days, then just in      the morning.  9. K-Dur 10 mEq 1 daily for 2 days, then half a tab daily to equal 5      mg daily.  10.Inspra 12.5 mg daily.  11.Coumadin 5 mg, none on December 13, 2008, 5 mg on December 14, 2008, and 5 mg      on 12/15/08.  Then your Coumadin level will be checked on December 16, 2008.  12.Nitroglycerin 1/150 one sublingual as needed for chest pain.  13.Vitamin D as before.  14.Do not take  Aleve, ibuprofen, or vitamin E.   DISCHARGE INSTRUCTIONS:  1. A low sodium, heart-healthy diet.  2. Wash cath site with soap and water.  Call if any bleeding, swelling      or drainage.  3. May shower with help.  4. No lifting for 4 weeks.  5. No driving for 4 weeks.  6. Increase activity slowly.  7. Call for any problems.  8. Follow up with Dr. Jacinto Halim in 4 weeks.  The office will call with      date and time.  9. Anticoagulation:  Home health was to draw the blood and send to Dr.      Verl Dicker office.   HISTORY OF PRESENT ILLNESS:  An 77 year old white male was admitted  emergently December 04, 2008 with ST elevation MI.  A very active 82-year-  old, he developed chest pain that was described as chest heaviness on  the afternoon of December 04, 2008, resolved spontaneously.  The pain  reoccurred at 6:00 p.m. on June 24.  He had an episode of syncope and  tightness-type pain.  EMS was called. Nitroglycerin gave him partial  relief.  The pain did not radiate.  It was associated with diaphoresis  and nausea.  He had acute ST elevations in leads V1-V6 and AVL,  reciprocal ST depression.  He was taken emergently to the cardiac cath  lab still with chest pain, diaphoretic, and pale.   PAST MEDICAL HISTORY:  As previously stated, with back pain,   degenerative joint disease.   PAST SURGICAL HISTORY:  No history of coronary disease or diabetes.   Outpatient meds were only vitamin D, vitamin E, and Aleve.   ALLERGIES:  No known allergies.   For family history, social history, review of system, see H&P.   PHYSICAL EXAMINATION ON DISCHARGE:  Blood pressure at 93, 104 and 60s,  pulse 70s, respirations 20, temperature afebrile, oxygen saturation room  air and 92-99.  He was in no acute distress.  LUNGS:  Clear to auscultation bilaterally.  Cardiovascular: Regular rate and rhythm without murmur.  ABDOMEN:  Soft, nontender.  EXTREMITIES:  with 1+ edema at the ankles.   LABORATORY VALUES:  Initial cardiac markers:  Myoglobin 224.  Troponin I  less than 0.05, and CK-MB was 3.3.  Shortly thereafter, CK-MB's and  troponins were repeated.  CK was 5423.  MB 267. Follow-up CK-MB:  CK  5679, MB 581 and relative index 10 with a troponin of greater than 100.  The third CK-MB cardiac marker was CK 3229, MB of 276, and troponin  greater than 100.  On June 26, CK was down to 2267 with an MB of 124.   BNP initially was 395.  Was treated.  By July 2, it was 1003.  He was  diuresed, and at discharge he was 847.   Cholesterol.  On admission, total cholesterol 224, triglycerides 88, HDL  38, LDL 168.  Thyroid:  TSH was 1.145, and UA was clear.   On the hemoglobin at discharge 13.3, hematocrit 38.8, WBC 0.9, platelets  186.  On discharge, pro time 25.1, INR 2.1.   Chemistry at discharge:  Sodium 142, potassium 3.8, chloride 107, CO2  26, glucose 105, BUN 21, creatinine was 0.99, calcium 9.  LFTs:  SGOT is  52, SGPT 45, total protein 6.1.   RADIOLOGY:  1. Initial chest x-ray:  Cardiomegaly without pulmonary edema.  2. Follow-up x-rays.  Developed pulmonary edema by June 25.  An intra-  aortic balloon pump was in place.  Pulmonary edema had gradually      improved over the next several chest x-rays.  A PA and lateral was      done with  fever on June 30 with mild congestive heart failure.   Cardiac cath, as previously described.   A 2-D echo on June 28:  EF of 35-40, much improved over 25%, with  cardiac cath, left atrium mildly dilated and a trivial pericardial  effusion.   The patient gradually improved over hospitalization with assistance.  Please note, during his initial cardiac catheterization due to the  severity of the stenosis, Dr. Tyrone Sage with TCT[facility] evaluated the patient,  and he agreed with Dr. Jacinto Halim to proceed with intraaortic balloon pump  since stenting of the LAD, rather than emergent CABG.      Darcella Gasman. [place], N.P.      Cristy Hilts. Jacinto Halim, MD     LRI/MEDQ  D:  12/16/2008  T:  12/16/2008  Job:  [id number]   cc:   Massie Maroon, MD

## 2010-10-29 NOTE — Discharge Summary (Signed)
NAME:  Blake Patterson, MOKRY NO.:  [id number]   MEDICAL RECORD NO.:  [id number]          PATIENT TYPE:  INP   LOCATION:  3740                         FACILITY:  [facility]   PHYSICIAN:  Nanetta Batty, M.D.   DATE OF BIRTH:  06-09-1927   DATE OF ADMISSION:  12/16/2008  DATE OF DISCHARGE:  12/20/2008                               DISCHARGE SUMMARY   DISCHARGE DIAGNOSES:  1. Acute systolic congestive heart failure.  2. Ischemic cardiomyopathy with an ejection fraction of 25%-40%.  3. Coronary disease with anterior wall myocardial infarction with left      anterior descending stenting, December 04, 2008.  4. Left ventricular thrombus, on Coumadin.  5. Treated hypertension.   HOSPITAL COURSE:  The patient is an 77 year old male followed by Dr.  Jacinto Halim.  His primary care doctor is Dr. Selena Batten.  He presented with an acute  MI and underwent intervention with balloon pump, December 04, 2008.  He had  a prolonged hospitalization because of cardiogenic shock and was  discharged 3 days prior to this admission.  He presented on December 16, 2008, after 2 episodes of nitrate responsive chest pain and some  bilateral lower extremity edema.  He was seen by Dr. Allyson Sabal on admission.  BNP was 1154.  Troponins were 0.45, 0.35, and 0.3.  INR was therapeutic  on admission.  The patient was admitted to telemetry.  Enzymes were  cycled.  He had some problems with hypotension.  We had to back off on  his Coreg after admission.  His INR drifted down slowly.  We stopped his  Effient and changed him to Plavix to decrease the risk of bleeding.  He  was ambulated without problems.  We resumed his Coumadin.  We feel he  can be discharged, December 20, 2008.  He did diurese and his BNP came down  to 800.   DISCHARGE MEDICATIONS:  1. Coumadin 5 mg a day or as directed.  2. Aspirin 81 mg 2 tablets a day.  3. Crestor 20 mg a day.  4. Pepcid 40 mg a day.  5. Lisinopril 5 mg a day.  6. Coreg 3.125 twice a day.  7. Colace  100 mg p.r.n. b.i.d.  8. Lasix 40 mg a day.  9. Potassium 20 mEq a day.  10.Inspra 12.5 mg a day.  11.Nitroglycerin sublingual p.r.n.  12.Vitamin D.  13.Plavix 75 mg a day.  14.Imdur 30 mg a day.  15.He has been instructed to stop his Effient.   LABORATORY DATA:  EKG shows sinus rhythm with anterior Q-waves.  Chest x-  ray shows improving pulmonary edema on the 26th.  White count 7.3,  hemoglobin 12.5, hematocrit 36.5, platelets 247.  INR is 2.5 at  discharge.  Sodium 140, potassium 4.2, BUN 19, creatinine 1.16.  LFTs  are normal.  Troponins are as noted above.  Urinalysis is unremarkable.  BNP at discharge 694.   DISPOSITION:  The patient is discharged in stable condition and we will  follow up with Dr. Mariah Milling after discharge.      Abelino Derrick, P.A.  Nanetta Batty, M.D.  Electronically Signed    LKK/MEDQ  D:  01/06/2009  T:  01/07/2009  Job:  [id number]

## 2013-04-03 ENCOUNTER — Ambulatory Visit (INDEPENDENT_AMBULATORY_CARE_PROVIDER_SITE_OTHER): Payer: Medicare Other | Admitting: Cardiovascular Disease

## 2013-04-03 ENCOUNTER — Encounter: Payer: Self-pay | Admitting: Cardiovascular Disease

## 2013-04-03 VITALS — BP 140/76 | HR 55 | Resp 16 | Ht 68.0 in | Wt 194.8 lb

## 2013-04-03 DIAGNOSIS — I2510 Atherosclerotic heart disease of native coronary artery without angina pectoris: Secondary | ICD-10-CM

## 2013-04-03 DIAGNOSIS — E785 Hyperlipidemia, unspecified: Secondary | ICD-10-CM

## 2013-04-03 DIAGNOSIS — I2589 Other forms of chronic ischemic heart disease: Secondary | ICD-10-CM

## 2013-04-03 DIAGNOSIS — I255 Ischemic cardiomyopathy: Secondary | ICD-10-CM

## 2013-04-03 NOTE — Patient Instructions (Signed)
Your physician recommends that you schedule a follow-up appointment in: 12 months.  

## 2013-04-04 ENCOUNTER — Encounter: Payer: Self-pay | Admitting: Cardiovascular Disease

## 2013-04-04 DIAGNOSIS — E785 Hyperlipidemia, unspecified: Secondary | ICD-10-CM | POA: Insufficient documentation

## 2013-04-04 DIAGNOSIS — I2510 Atherosclerotic heart disease of native coronary artery without angina pectoris: Secondary | ICD-10-CM | POA: Insufficient documentation

## 2013-04-04 DIAGNOSIS — I255 Ischemic cardiomyopathy: Secondary | ICD-10-CM | POA: Insufficient documentation

## 2013-04-04 NOTE — Assessment & Plan Note (Signed)
Despite very low statin dose, has a remarkably good reduction in TC and LDL-C. Continue same treatment.

## 2013-04-04 NOTE — Assessment & Plan Note (Addendum)
STEMI 2010 20-30% left main, 30% RCA, 80% OM, Occluded proximal LAD - stent 3.5x38 Zeta and 3.0x12 miniVision, rescue POBA of diagonal. Angina free Nuclear study 01/2009 - extensive LAD scar, no ischemia in LCX territory. Angina free.

## 2013-04-04 NOTE — Progress Notes (Signed)
Patient ID: Blake Patterson, male   DOB: 1926/08/12, 79 y.o.   MRN: [id number]      Reason for office visit CAD  Four years after a large LAD distribution STEMI, with resulting mild to moderate LV dysfunction (EF 40%), Blake Patterson feels well and is quite active for his age. He is free of angina and has never had signs or symptoms of CHF. His medical regimen has not been the standard post MI cocktail due to problems with bradycardia and a remarkable sensitivity to usual doses of statin. He is asymptomatic.   No Known Allergies  Current Outpatient Prescriptions  Medication Sig Dispense Refill  . aspirin EC 81 MG tablet Take 81 mg by mouth daily.      Marland Kitchen CINNAMON PO Take 1,000 mg by mouth daily.      . Coenzyme Q10 200 MG capsule Take 200 mg by mouth daily.      Marland Kitchen eplerenone (INSPRA) 25 MG tablet Take 12.5 mg by mouth daily.       . famotidine (PEPCID) 40 MG tablet Take 40 mg by mouth daily.       Marland Kitchen GARLIC PO Take 1 tablet by mouth daily.      Marland Kitchen LIVALO 2 MG TABS Take 1 tablet by mouth 2 (two) times a week.       . Omega-3 Fatty Acids (FISH OIL) 1200 MG CAPS Take 1 capsule by mouth daily.      Marland Kitchen pyridOXINE (VITAMIN B-6) 100 MG tablet Take 100 mg by mouth daily.      . vitamin B-12 (CYANOCOBALAMIN) 1000 MCG tablet Take 1,000 mcg by mouth daily.      . cholecalciferol (VITAMIN D) 1000 UNITS tablet Take 1,000 Units by mouth daily.      . furosemide (LASIX) 20 MG tablet Take 20 mg by mouth as needed.       No current facility-administered medications for this visit.    Past Medical History  Diagnosis Date  . CAD (coronary artery disease)   . Ischemic cardiomyopathy   . Dyslipidemia   . Cardiac conduction disorder   . Left ventricular apical thrombus     Past Surgical History  Procedure Laterality Date  . Cardiac catheterization  12/04/2008    Proximal and Distal LAD, stented w/ a non-drug-eluting 3.5x26mm ZETA stent at 8atm, distally w/ a 3x63mm VISION stent, resulting in reduction of  100% occlusion to less than 30%.  . Cardiovascular stress test  01/13/2009    Significant perfusion seen in Apical, Basal Anterior, Basal Anteroseptal, and Apical Anterior regions-consistent w/ infarct/scar. EKG negative for ischemia. No ECG changes. No significant ischemia demonstrated.  . Transthoracic echocardiogram  03/25/2009    EF 35-45%, mild-moderate global hypokinesis    No family history on file.  History   Social History  . Marital Status: Married    Spouse Name: N/A    Number of Children: N/A  . Years of Education: N/A   Occupational History  . Not on file.   Social History Main Topics  . Smoking status: Former Smoker    Quit date: 04/03/1953  . Smokeless tobacco: Never Used  . Alcohol Use: No  . Drug Use: No  . Sexual Activity: Not on file   Other Topics Concern  . Not on file   Social History Narrative  . No narrative on file    Review of systems: The patient specifically denies any chest pain at rest or with exertion, dyspnea at rest or with exertion,  orthopnea, paroxysmal nocturnal dyspnea, syncope, palpitations, focal neurological deficits, intermittent claudication, lower extremity edema, unexplained weight gain, cough, hemoptysis or wheezing.  The patient also denies abdominal pain, nausea, vomiting, dysphagia, diarrhea, constipation, polyuria, polydipsia, dysuria, hematuria, frequency, urgency, abnormal bleeding or bruising, fever, chills, unexpected weight changes, mood swings, change in skin or hair texture, change in voice quality, auditory or visual problems, allergic reactions or rashes, new musculoskeletal complaints other than usual "aches and pains".   PHYSICAL EXAM BP 140/76  Pulse 55  Resp 16  Ht 5\' 8"  (1.727 m)  Wt 194 lb 12.8 oz (88.361 kg)  BMI 29.63 kg/m2  General: Alert, oriented x3, no distress Head: no evidence of trauma, PERRL, EOMI, no exophtalmos or lid lag, no myxedema, no xanthelasma; normal ears, nose and oropharynx Neck:  normal jugular venous pulsations and no hepatojugular reflux; brisk carotid pulses without delay and no carotid bruits Chest: clear to auscultation, no signs of consolidation by percussion or palpation, normal fremitus, symmetrical and full respiratory excursions Cardiovascular: normal position and quality of the apical impulse, regular rhythm, normal first and second heart sounds, no murmurs, rubs or gallops Abdomen: no tenderness or distention, no masses by palpation, no abnormal pulsatility or arterial bruits, normal bowel sounds, no hepatosplenomegaly Extremities: no clubbing, cyanosis or edema; 2+ radial, ulnar and brachial pulses bilaterally; 2+ right femoral, posterior tibial and dorsalis pedis pulses; 2+ left femoral, posterior tibial and dorsalis pedis pulses; no subclavian or femoral bruits Neurological: grossly nonfocal   EKG: Sinus brady with very long first degree AV block (>300 ms), QS in V1-V2, no repol changes  Lipid Panel   12/2012 TC 136, TG 73, HDL 56, LDL 65      Component Value Date/Time   CHOL  Value: 224        ATP III CLASSIFICATION:  <200     mg/dL   Desirable  161-096  mg/dL   Borderline High  >=045    mg/dL   High       * 09/19/8117 2335   TRIG 88 12/04/2008 2335   HDL 38* 12/04/2008 2335   CHOLHDL 5.9 12/04/2008 2335   VLDL 18 12/04/2008 2335   LDLCALC  Value: 168        Total Cholesterol/HDL:CHD Risk Coronary Heart Disease Risk Table                     Men   Women  1/2 Average Risk   3.4   3.3  Average Risk       5.0   4.4  2 X Average Risk   9.6   7.1  3 X Average Risk  23.4   11.0        Use the calculated Patient Ratio above and the CHD Risk Table to determine the patient's CHD Risk.        ATP III CLASSIFICATION (LDL):  <100     mg/dL   Optimal  147-829  mg/dL   Near or Above                    Optimal  130-159  mg/dL   Borderline  562-130  mg/dL   High  >865     mg/dL   Very High* 7/84/6962 2335    BMET  12/2012 normal LFts,  Creat 1.2, glucose 108       Component Value Date/Time   NA 140 02/20/2010 0455   K 4.1 02/20/2010 0455   CL 107  02/20/2010 0455   CO2 26 02/20/2010 0455   GLUCOSE 106* 02/20/2010 0455   BUN 22 02/20/2010 0455   CREATININE 1.05 02/20/2010 0455   CALCIUM 9.0 02/20/2010 0455   GFRNONAA >60 02/20/2010 0455   GFRAA  Value: >60        The eGFR has been calculated using the MDRD equation. This calculation has not been validated in all clinical situations. eGFR's persistently <60 mL/min signify possible Chronic Kidney Disease. 02/20/2010 0455     ASSESSMENT AND PLAN CAD (coronary artery disease) STEMI 2010 20-30% left main, 30% RCA, 80% OM, Occluded proximal LAD - stent 3.5x38 Zeta and 3.0x12 miniVision, rescue POBA of diagonal. Angina free Nuclear study 01/2009 - extensive LAD scar, no ischemia in LCX territory. Angina free.  Cardiomyopathy, ischemic Despite anterior hypokinesis and EF around 40% he has never had overt CHF. He takes furosemide very rarely (less than monthly) for ankle swelling. Unfortunately, had to stop beta blockers due to bradycardia and AV conduction disease. Remotely had an LV apical thrombus, but is no longer on warfarin. On aldosterone inhibitor  Hyperlipidemia Despite very low statin dose, has a remarkably good reduction in TC and LDL-C. Continue same treatment.  Orders Placed This Encounter  Procedures  . EKG 12-Lead   Meds ordered this encounter  Medications  . Coenzyme Q10 200 MG capsule    Sig: Take 200 mg by mouth daily.  Marland Kitchen pyridOXINE (VITAMIN B-6) 100 MG tablet    Sig: Take 100 mg by mouth daily.  . vitamin B-12 (CYANOCOBALAMIN) 1000 MCG tablet    Sig: Take 1,000 mcg by mouth daily.  Marland Kitchen GARLIC PO    Sig: Take 1 tablet by mouth daily.  . Omega-3 Fatty Acids (FISH OIL) 1200 MG CAPS    Sig: Take 1 capsule by mouth daily.  Marland Kitchen CINNAMON PO    Sig: Take 1,000 mg by mouth daily.    Junious Silk, MD, [facility] [phone] office [phone] pager

## 2013-04-04 NOTE — Assessment & Plan Note (Signed)
Despite anterior hypokinesis and EF around 40% he has never had overt CHF. He takes furosemide very rarely (less than monthly) for ankle swelling. Unfortunately, had to stop beta blockers due to bradycardia and AV conduction disease. Remotely had an LV apical thrombus, but is no longer on warfarin. On aldosterone inhibitor

## 2013-04-05 ENCOUNTER — Encounter: Payer: Self-pay | Admitting: Cardiovascular Disease

## 2013-05-24 ENCOUNTER — Encounter: Payer: Self-pay | Admitting: Cardiovascular Disease

## 2013-05-31 ENCOUNTER — Other Ambulatory Visit: Payer: Self-pay | Admitting: Cardiovascular Disease

## 2013-05-31 NOTE — Telephone Encounter (Signed)
Rx was sent to pharmacy electronically. 

## 2013-08-27 ENCOUNTER — Inpatient Hospital Stay (HOSPITAL_COMMUNITY)
Admission: EM | Admit: 2013-08-27 | Discharge: 2013-08-28 | DRG: 287 | Disposition: A | Discharge status: Home / Self Care | Payer: Medicare Other | Attending: Internal Medicine | Admitting: Internal Medicine

## 2013-08-27 ENCOUNTER — Encounter (HOSPITAL_COMMUNITY): Payer: Self-pay | Admitting: Emergency Medicine

## 2013-08-27 ENCOUNTER — Encounter (HOSPITAL_COMMUNITY)
Admission: EM | Disposition: A | Discharge status: Home / Self Care | Payer: Self-pay | Source: Home / Self Care | Attending: Internal Medicine

## 2013-08-27 ENCOUNTER — Emergency Department (HOSPITAL_COMMUNITY): Payer: Medicare Other

## 2013-08-27 DIAGNOSIS — I2510 Atherosclerotic heart disease of native coronary artery without angina pectoris: Principal | ICD-10-CM | POA: Diagnosis present

## 2013-08-27 DIAGNOSIS — I2589 Other forms of chronic ischemic heart disease: Secondary | ICD-10-CM | POA: Diagnosis present

## 2013-08-27 DIAGNOSIS — I200 Unstable angina: Secondary | ICD-10-CM | POA: Insufficient documentation

## 2013-08-27 DIAGNOSIS — Y831 Surgical operation with implant of artificial internal device as the cause of abnormal reaction of the patient, or of later complication, without mention of misadventure at the time of the procedure: Secondary | ICD-10-CM | POA: Diagnosis present

## 2013-08-27 DIAGNOSIS — E119 Type 2 diabetes mellitus without complications: Secondary | ICD-10-CM | POA: Diagnosis present

## 2013-08-27 DIAGNOSIS — I252 Old myocardial infarction: Secondary | ICD-10-CM

## 2013-08-27 DIAGNOSIS — Z9861 Coronary angioplasty status: Secondary | ICD-10-CM

## 2013-08-27 DIAGNOSIS — Z87891 Personal history of nicotine dependence: Secondary | ICD-10-CM

## 2013-08-27 DIAGNOSIS — I255 Ischemic cardiomyopathy: Secondary | ICD-10-CM

## 2013-08-27 DIAGNOSIS — E785 Hyperlipidemia, unspecified: Secondary | ICD-10-CM | POA: Diagnosis present

## 2013-08-27 DIAGNOSIS — T82897A Other specified complication of cardiac prosthetic devices, implants and grafts, initial encounter: Secondary | ICD-10-CM | POA: Diagnosis present

## 2013-08-27 HISTORY — DX: Acute myocardial infarction, unspecified: I21.9

## 2013-08-27 HISTORY — PX: LEFT HEART CATHETERIZATION WITH CORONARY ANGIOGRAM: SHX5451

## 2013-08-27 HISTORY — DX: Presence of other vascular implants and grafts: Z95.828

## 2013-08-27 LAB — CBC
HEMATOCRIT: 44.4 % (ref 39.0–52.0)
HEMOGLOBIN: 15 g/dL (ref 13.0–17.0)
MCH: 31 pg (ref 26.0–34.0)
MCHC: 33.8 g/dL (ref 30.0–36.0)
MCV: 91.7 fL (ref 78.0–100.0)
Platelets: 122 10*3/uL — ABNORMAL LOW (ref 150–400)
RBC: 4.84 MIL/uL (ref 4.22–5.81)
RDW: 13.8 % (ref 11.5–15.5)
WBC: 5.8 10*3/uL (ref 4.0–10.5)

## 2013-08-27 LAB — BASIC METABOLIC PANEL
BUN: 32 mg/dL — AB (ref 6–23)
CALCIUM: 9.5 mg/dL (ref 8.4–10.5)
CO2: 23 mEq/L (ref 19–32)
CREATININE: 0.9 mg/dL (ref 0.50–1.35)
Chloride: 104 mEq/L (ref 96–112)
GFR calc Af Amer: 87 mL/min — ABNORMAL LOW (ref >90)
GFR, EST NON AFRICAN AMERICAN: 75 mL/min — AB (ref >90)
GLUCOSE: 121 mg/dL — AB (ref 70–99)
Potassium: 4.5 mEq/L (ref 3.7–5.3)
Sodium: 141 mEq/L (ref 137–147)

## 2013-08-27 LAB — PROTIME-INR
INR: 1.02 (ref 0.00–1.49)
Prothrombin Time: 13.2 seconds (ref 11.6–15.2)

## 2013-08-27 LAB — GLUCOSE, CAPILLARY
GLUCOSE-CAPILLARY: 103 mg/dL — AB (ref 70–99)
Glucose-Capillary: 126 mg/dL — ABNORMAL HIGH (ref 70–99)

## 2013-08-27 LAB — APTT: aPTT: 30 seconds (ref 24–37)

## 2013-08-27 LAB — TROPONIN I: Troponin I: <0.3 ng/mL (ref <0.30)

## 2013-08-27 LAB — I-STAT TROPONIN, ED: Troponin i, poc: 0.01 ng/mL (ref 0.00–0.08)

## 2013-08-27 SURGERY — LEFT HEART CATHETERIZATION WITH CORONARY ANGIOGRAM
Anesthesia: LOCAL

## 2013-08-27 MED ORDER — METOPROLOL TARTRATE 12.5 MG HALF TABLET
12.5000 mg | ORAL_TABLET | Freq: Two times a day (BID) | ORAL | Status: DC
  Filled 2013-08-27: qty 1

## 2013-08-27 MED ORDER — ISOSORBIDE MONONITRATE ER 30 MG PO TB24: 30.0000 mg | ORAL_TABLET | Freq: Every day | ORAL | Status: DC

## 2013-08-27 MED ORDER — NITROGLYCERIN 0.2 MG/ML ON CALL CATH LAB
INTRAVENOUS | Status: AC
  Filled 2013-08-27: qty 1

## 2013-08-27 MED ORDER — ASPIRIN EC 81 MG PO TBEC: 81.0000 mg | DELAYED_RELEASE_TABLET | Freq: Every day | ORAL | Status: DC

## 2013-08-27 MED ORDER — LIDOCAINE HCL (PF) 1 % IJ SOLN
INTRAMUSCULAR | Status: AC
  Filled 2013-08-27: qty 30

## 2013-08-27 MED ORDER — HEPARIN SODIUM (PORCINE) 5000 UNIT/ML IJ SOLN
5000.0000 [IU] | Freq: Three times a day (TID) | INTRAMUSCULAR | Status: DC
Start: 2013-08-27 — End: 2013-08-28
  Administered 2013-08-27 – 2013-08-28 (×3): 5000 [IU] via SUBCUTANEOUS

## 2013-08-27 MED ORDER — ATORVASTATIN CALCIUM 40 MG PO TABS
40.0000 mg | ORAL_TABLET | Freq: Every day | ORAL | Status: DC
  Filled 2013-08-27: qty 1

## 2013-08-27 MED ORDER — ATORVASTATIN CALCIUM 20 MG PO TABS
20.0000 mg | ORAL_TABLET | Freq: Every day | ORAL | Status: DC
Start: 2013-08-27 — End: 2013-08-27
  Filled 2013-08-27: qty 1

## 2013-08-27 MED ORDER — SODIUM CHLORIDE 0.9 % IJ SOLN: 3.0000 mL | Freq: Two times a day (BID) | INTRAMUSCULAR | Status: DC

## 2013-08-27 MED ORDER — SODIUM CHLORIDE 0.9 % IJ SOLN: 3.0000 mL | INTRAMUSCULAR | Status: DC | PRN

## 2013-08-27 MED ORDER — SODIUM CHLORIDE 0.9 % IV SOLN
INTRAVENOUS | Status: DC
  Administered 2013-08-27 – 2013-08-28 (×2): via INTRAVENOUS

## 2013-08-27 MED ORDER — NITROGLYCERIN 0.4 MG SL SUBL: 0.4000 mg | SUBLINGUAL_TABLET | SUBLINGUAL | Status: DC | PRN

## 2013-08-27 MED ORDER — INSULIN ASPART 100 UNIT/ML ~~LOC~~ SOLN: 0.0000 [IU] | Freq: Three times a day (TID) | SUBCUTANEOUS | Status: DC

## 2013-08-27 MED ORDER — HEPARIN BOLUS VIA INFUSION
4000.0000 [IU] | Freq: Once | INTRAVENOUS | Status: AC
  Administered 2013-08-27: 4000 [IU] via INTRAVENOUS
  Filled 2013-08-27: qty 4000

## 2013-08-27 MED ORDER — ATORVASTATIN CALCIUM 40 MG PO TABS: 40.0000 mg | ORAL_TABLET | Freq: Every day | ORAL | Status: DC

## 2013-08-27 MED ORDER — HEPARIN (PORCINE) IN NACL 100-0.45 UNIT/ML-% IJ SOLN
1000.0000 [IU]/h | INTRAMUSCULAR | Status: DC
  Administered 2013-08-27: 1000 [IU]/h via INTRAVENOUS
  Filled 2013-08-27: qty 250

## 2013-08-27 MED ORDER — MIDAZOLAM HCL 2 MG/2ML IJ SOLN
INTRAMUSCULAR | Status: AC
  Filled 2013-08-27: qty 2

## 2013-08-27 MED ORDER — SODIUM CHLORIDE 0.9 % IV SOLN: 250.0000 mL | INTRAVENOUS | Status: DC | PRN

## 2013-08-27 MED ORDER — ASPIRIN 81 MG PO CHEW: 324.0000 mg | CHEWABLE_TABLET | ORAL | Status: AC

## 2013-08-27 MED ORDER — ASPIRIN 81 MG PO CHEW: 81.0000 mg | CHEWABLE_TABLET | ORAL | Status: DC

## 2013-08-27 MED ORDER — SODIUM CHLORIDE 0.9 % IV SOLN
INTRAVENOUS | Status: DC
  Administered 2013-08-27: 75 mL/h via INTRAVENOUS

## 2013-08-27 MED ORDER — ASPIRIN EC 81 MG PO TBEC
81.0000 mg | DELAYED_RELEASE_TABLET | Freq: Every day | ORAL | Status: DC
  Filled 2013-08-27 (×2): qty 1

## 2013-08-27 MED ORDER — ASPIRIN 300 MG RE SUPP
300.0000 mg | RECTAL | Status: AC
  Filled 2013-08-27: qty 1

## 2013-08-27 MED ORDER — FENTANYL CITRATE 0.05 MG/ML IJ SOLN
INTRAMUSCULAR | Status: AC
  Filled 2013-08-27: qty 2

## 2013-08-27 MED ORDER — FAMOTIDINE 40 MG PO TABS
40.0000 mg | ORAL_TABLET | Freq: Every day | ORAL | Status: DC
  Filled 2013-08-27: qty 1

## 2013-08-27 MED ORDER — HEPARIN (PORCINE) IN NACL 2-0.9 UNIT/ML-% IJ SOLN
INTRAMUSCULAR | Status: AC
  Filled 2013-08-27: qty 1000

## 2013-08-27 NOTE — ED Provider Notes (Signed)
Medical screening examination/treatment/procedure(s) were conducted as a shared visit with non-physician practitioner(s) and myself.  I personally evaluated the patient during the encounter.   EKG Interpretation   Date/Time:  Tuesday August 27 2013 04:57:15 EDT Ventricular Rate:  71 PR Interval:  290 QRS Duration: 91 QT Interval:  394 QTC Calculation: 428 R Axis:   22 Text Interpretation:  Sinus rhythm with first degree av block Ventricular  premature complex Anterior infarct, old Confirmed by DELOS  MD, DOUGLAS  ([id number]) on 08/27/2013 6:13:45 AM      Concerning for unstable angina. Cardiology consultation requested   Hoy Morn, MD 08/27/13 [phone]

## 2013-08-27 NOTE — Progress Notes (Signed)
ANTICOAGULATION CONSULT NOTE - Initial Consult  Pharmacy Consult for heparin Indication: chest pain/ACS  No Known Allergies  Patient Measurements: Height: 5\' 8"  (172.7 cm) Weight: 186 lb (84.369 kg) IBW/kg (Calculated) : 68.4 Heparin Dosing Weight: 84kg  Vital Signs: Temp: 98.1 F (36.7 C) (03/17 0959) Temp src: Oral (03/17 0959) BP: 131/73 mmHg (03/17 1245) Pulse Rate: 65 (03/17 1245)  Labs:  Recent Labs  08/27/13 0511 08/27/13 0714  HGB 15.0  --   HCT 44.4  --   PLT 122*  --   CREATININE 0.90  --   TROPONINI  --  <0.30    Estimated Creatinine Clearance: 63.5 ml/min (by C-G formula based on Cr of 0.9).   Medical History: Past Medical History  Diagnosis Date  . CAD (coronary artery disease)   . Ischemic cardiomyopathy   . Dyslipidemia   . Cardiac conduction disorder   . Left ventricular apical thrombus   . MI (myocardial infarction)   . Presence of stent in artery     Assessment: 63 YOM with history of CAD s/p STEMI in 2010- stent placed at that time. Patient presents with CP that has been off and on over the past few weeks. Trop neg x1 so far. Cards planning on cath today to evaluate for any changes in anatomy since 2010. Confirmed with patient he was not on anticoagulation therapy PTA. No bleeding or bruising recently. Baseline hgb 15, plts 122.  Goal of Therapy:  Heparin level 0.3-0.7 units/ml Monitor platelets by anticoagulation protocol: Yes   Plan:  1. Heparin bolus with 4000 units IV x1 2. Start heparin drip at 1000 units/hr 3. 8 hour HL or f/u after cath 4. Daily HL and CBC if continued on heparin 5. Follow for s/s bleeding 6. Baseline aPTT and INR have been ordered  Blake Patterson, PharmD, BCPS Clinical Pharmacist Pager: [phone] 08/27/2013 12:50 PM

## 2013-08-27 NOTE — Progress Notes (Signed)
UR Completed Kamber Vignola Graves-Bigelow, RN,BSN [phone]  

## 2013-08-27 NOTE — CV Procedure (Signed)
Blake Patterson is a 80 y.o. male    [id number]  [id number] LOCATION:  FACILITY: [facility]  PHYSICIAN: Troy Sine, MD, [facility] 1926/09/19   DATE OF PROCEDURE:  08/27/2013    CARDIAC CATHETERIZATION     HISTORY: Mr. Blake Patterson is an 80 year old white male who suffered an anterior wall myocardial infarction in 2010 and underwent stenting of his LAD with tandem 3.5x38 Zeta, and 3.0x12 mm mini vision bare metal stents and rescue POBA of his diagonal vessel. He did develop cardiogenic shock and need for intra-aortic balloon pulsation. He did have an 80% marginal stenosis and 30% RCA stenosis. A nuclear study in August 2000 and showed extensive LAD scar without ischemia in the circumflex territory. He was admitted in the hospital today with a several week history of dyspnea on exertion. He awakened this morning with chest tightness and dyspnea. Initial troponins upon arrival to the hospital were negative. Because of worrisome symptoms he is now referred for definitive repeat cardiac catheterization.   PROCEDURE:  The patient arrived to the catheterization laboratory complaining of bilateral shoulder discomfort without chest tightness. Versed 1 mg and fentanyl 25 mcg were administered. Catheterization was done via the right femoral approach and a 5 French arterial sheath was inserted without difficulty. Diagnostic catheterization was done utilizing 5 French at this 4 left and right coronary catheters. A 5 French pigtail catheter was used for RAO ventriculography. Hemostasis was obtained by direct manual pressure. The patient tolerated the procedure well.  HEMODYNAMICS:   Central Aorta: 140/77   Left Ventricle: 140/19  ANGIOGRAPHY:  Left main coronary artery was a large vessel that had distal tapering and narrowing that was smooth a 40-50% prior to trifurcating into an LAD, in remission needed vessel, and left circumflex coronary artery.  Left anterior descending artery had tandem stance extending  from the ostium to the proximal third of the vessel with a moderate size septal perforating artery and first diagonal vessel arose from the stented segment. The proximal LAD stented region there was mild in-stent restenosis of 40 to less than 50%. There was TIMI-3 flow. The LAD was otherwise widely patent and extended and wrapped around the LV apex. The vessel gave rise to 2 additional diagonal and several septal perforator arteries.  The ramus intermediate vessel was angiographically normal.  The left circumflex vessel was anatomically normal giving rise to one major marginal vessel. There was mild 20% narrowing in his marginal branch.  The right coronary artery was a large caliber dominant vessel that gave rise to a large PDA, 2  inferior LV branches and a large PLA vessel.  RAO ventriculography revealed LV dilatation with moderate residual hypocontractility involving anterolateral wall. Image was suboptimal but a qualitative ejection fraction estimate was approximately 35 to less than 40%.   IMPRESSION:  Ischemic cardiomyopathy with an ejection fraction of 35 to less than 40% with residual hypocontractility in this patient status post remote ST segment elevation anterior wall myocardial infarction secondary to previously documented total LAD occlusion.  Smooth 30% distal tapering of the left main coronary artery  Tandem ostial to proximal LAD stents with recurrent percent smooth in-stent restenosis proximal to the diagonal vessel without restenosis of the diagonal vessel and a large LAD system which extends and wraps around the LV apex.  Normal ramus intermediate vessel  No significant disease in the left circumflex with mild 20% smooth narrowing the marginal branch and a large angiographically normal dominant right coronary artery  RECOMMENDATION:  Medical therapy  Destin Kittler A.  [name], MD, [facility] 08/27/2013 6:42 PM

## 2013-08-27 NOTE — ED Notes (Signed)
Pt arrives via EMS from home. Woke up around 0200 w chest tightness, took NTG and TUMS and pain went away for a few minutes and came back. Upon ED arrival pt is not complaining of pain. 324 ASA. 2L [place]. BP 140/85 HR 30-60 96% RA. CBG 131. 20 LH.

## 2013-08-27 NOTE — H&P (Addendum)
Primary cardiologist: Croituro  Reason for admit: [place]  HPI:  Blake Patterson is a very pleasant 80 y/o male with a h/o CAD s/p anterior STEMI in 2010,  DM2, HL and iCM 40%. Presents to ER with CP.   In 2010 cath showed:  LM 20-30%  pLAD - totally occluded OM 80%  RCA 30% Underwent stent 3.5x38 Zeta and 3.0x12 miniVision stenting of LAD, rescue POBA of diagonal. OM not approached as patient developed contrast nephropathy in setting of cardiogenic shock and need for IABP.   Nuclear study 01/2009 - extensive LAD scar, no ischemia in LCX territory. Angina free.  Over past couple of weeks has developed DOE. Today awoke at 2am with chest tightness and dyspnea. Took NTG and TUMS and pain went away for a few minutes and then came back. Came to ED. Pain free. Trop - x1. ECG with NSR 71 1 AVB anterior Qs. No ST-T wave abnormalities.    Review of Systems:     Cardiac Review of Systems: {Y] = yes [ ]  = no  Chest Pain [ y   ]  Resting SOB [ y  ] Exertional SOB  Blue.Reese  ]  Orthopnea [  ]   Pedal Edema [   ]    Palpitations [  ] Syncope  [  ]   Presyncope [   ]  General Review of Systems: [Y] = yes [  ]=no Constitional: recent weight change [  ]; anorexia [  ]; fatigue [ y ]; nausea [  ]; night sweats [  ]; fever [  ]; or chills [  ];                                                                                                                                          Dental: poor dentition[  ];   Eye : blurred vision [  ]; diplopia [   ]; vision changes [  ];  Amaurosis fugax[  ]; Resp: cough [  ];  wheezing[  ];  hemoptysis[  ]; shortness of breath[  ]; paroxysmal nocturnal dyspnea[  ]; dyspnea on exertion[ y ]; or orthopnea[  ];  GI:  gallstones[  ], vomiting[  ];  dysphagia[  ]; melena[  ];  hematochezia [  ]; heartburn[  ];   GU: kidney stones [  ]; hematuria[  ];   dysuria [  ];  nocturia[  ];  history of     obstruction [  ];                 Skin: rash, swelling[  ];, hair loss[  ];  peripheral  edema[  ];  or itching[  ]; Musculosketetal: myalgias[  ];  joint swelling[  ];  joint erythema[  ];  joint pain[ y ];  back pain[  ];  Heme/Lymph: bruising[  ];  bleeding[  ];  anemia[  ];  Neuro: TIA[  ];  headaches[  ];  stroke[  ];  vertigo[  ];  seizures[  ];   paresthesias[  ];  difficulty walking[  ];  Psych:depression[  ]; anxiety[  ];  Endocrine: diabetes[  ];  thyroid dysfunction[  ];  Other:  Past Medical History  Diagnosis Date  . CAD (coronary artery disease)   . Ischemic cardiomyopathy   . Dyslipidemia   . Cardiac conduction disorder   . Left ventricular apical thrombus   . MI (myocardial infarction)   . Presence of stent in artery      (Not in a hospital admission)   No Known Allergies  History   Social History  . Marital Status: Married    Spouse Name: N/A    Number of Children: N/A  . Years of Education: N/A   Occupational History  . Not on file.   Social History Main Topics  . Smoking status: Former Smoker    Quit date: 04/03/1953  . Smokeless tobacco: Never Used  . Alcohol Use: No  . Drug Use: No  . Sexual Activity: Not on file   Other Topics Concern  . Not on file   Social History Narrative  . No narrative on file   FAMILY HISTORY: No family h/o premature CAD.   PHYSICAL EXAM: Filed Vitals:   08/27/13 1100  BP: 117/67  Pulse: 69  Temp:   Resp: 18   General:  Elderly Well appearing. No respiratory difficulty HEENT: normal Neck: supple. JVP 7. Carotids 2+ bilat; no bruits. No lymphadenopathy or thryomegaly appreciated. Cor: PMI nondisplaced. Regular rate & rhythm. No rubs, gallops or murmurs. Lungs: clear Abdomen: soft, nontender, nondistended. No hepatosplenomegaly. No bruits or masses. Good bowel sounds. Extremities: no cyanosis, clubbing, rash, edema Neuro: alert & oriented x 3, cranial nerves grossly intact. moves all 4 extremities w/o difficulty. Affect pleasant.  ECG: NSR 71 1 AVB anterior Qs. No ST-T wave abnormalities.    Results for orders placed during the hospital encounter of 08/27/13 (from the past 24 hour(s))  CBC     Status: Abnormal   Collection Time    08/27/13  5:11 AM      Result Value Ref Range   WBC 5.8  4.0 - 10.5 K/uL   RBC 4.84  4.22 - 5.81 MIL/uL   Hemoglobin 15.0  13.0 - 17.0 g/dL   HCT 44.4  39.0 - 52.0 %   MCV 91.7  78.0 - 100.0 fL   MCH 31.0  26.0 - 34.0 pg   MCHC 33.8  30.0 - 36.0 g/dL   RDW 13.8  11.5 - 15.5 %   Platelets 122 (*) 150 - 400 K/uL  BASIC METABOLIC PANEL     Status: Abnormal   Collection Time    08/27/13  5:11 AM      Result Value Ref Range   Sodium 141  137 - 147 mEq/L   Potassium 4.5  3.7 - 5.3 mEq/L   Chloride 104  96 - 112 mEq/L   CO2 23  19 - 32 mEq/L   Glucose, Bld 121 (*) 70 - 99 mg/dL   BUN 32 (*) 6 - 23 mg/dL   Creatinine, Ser 0.90  0.50 - 1.35 mg/dL   Calcium 9.5  8.4 - 10.5 mg/dL   GFR calc non Af Amer 75 (*) >90 mL/min   GFR calc Af Amer 87 (*) >90 mL/min  I-STAT TROPOININ, ED     Status: None  Collection Time    08/27/13  5:15 AM      Result Value Ref Range   Troponin i, poc 0.01  0.00 - 0.08 ng/mL   Comment 3           TROPONIN I     Status: None   Collection Time    08/27/13  7:14 AM      Result Value Ref Range   Troponin I <0.30  <0.30 ng/mL   Dg Chest Port 1 View  08/27/2013   CLINICAL DATA:  Chest tightness.  Shortness of breath.  EXAM: PORTABLE CHEST - 1 VIEW  COMPARISON:  None.  FINDINGS: There is cardiomegaly without edema. Lungs clear. No pneumothorax or pleural effusion.  IMPRESSION: Cardiomegaly without acute disease.   Electronically Signed   By: Inge Rise M.D.   On: 08/27/2013 05:41     ASSESSMENT: 1. [place] 2. CAD s/p previous anterior MI with stenting of T LAD in 2010 3. Ischemic Cardiomyopathy EF 40% 4. H/o contrast nephropathy with cath in 2010 5. DM2  PLAN/DISCUSSION:  Symptoms very concerning for [place]. Will need cath today to evaluate for progression of OM disease or new blockages. Hydrate pre-cath to  reduce risk of contrast nephropathy. Risk of recurrent contrast nephropathy discussed. Patient and family willing to proceed. Treat with ASA, heparin, statin and b-blocker. Hold metformin and eplernone for now. Cover with SSI.   Blake Bensimhon,MD 12:20 PM

## 2013-08-27 NOTE — ED Provider Notes (Signed)
CSN: [id number]     Arrival date & time 08/27/13  0447 History   First MD Initiated Contact with Patient 08/27/13 [phone]     Chief Complaint  Patient presents with  . Chest Pain     (Consider location/radiation/quality/duration/timing/severity/associated sxs/prior Treatment) HPI Pt is an 80yo male with hx of CAD, 2 stents placed 12/04/08, and cardiac conduction disorder presenting to ED after episode of "chest tightness" that awoke pt from his sleep around 1am this morning. Pt states the "tightness" was not painful, "just pressure."  Pt states when he awoke he felt like he could not breath.  States he turned on the fan, took 1 nitro, and drank some cold water. This did help with his symptoms, however pt reports hx of known blockage in past that was unable to be stented due to decreased kidney function so he wanted to come be reevaluated.  Pt states he last saw his cardiologist, Dr. Ginnie Smart in Oct 2014, everything was normal at that time. Pt's wife states pt has been more irritable in the last few days. Pt states he does not recall episode of similar discomfort. Denies breaking out into sweats or nausea. Pt state he did feel well before going to sleep last night. Denies recent illness including cough, congestion, or fever.  Pt is on aspirin but no other blood thinners.  Denies any symptoms at this time.  Pt is seen by Dr. Ginnie Smart, cardiology  Past Medical History  Diagnosis Date  . CAD (coronary artery disease)   . Ischemic cardiomyopathy   . Dyslipidemia   . Cardiac conduction disorder   . Left ventricular apical thrombus   . MI (myocardial infarction)   . Presence of stent in artery    Past Surgical History  Procedure Laterality Date  . Cardiac catheterization  12/04/2008    Proximal and Distal LAD, stented w/ a non-drug-eluting 3.5x32mm ZETA stent at 8atm, distally w/ a 3x60mm VISION stent, resulting in reduction of 100% occlusion to less than 30%.  . Cardiovascular stress test  01/13/2009     Significant perfusion seen in Apical, Basal Anterior, Basal Anteroseptal, and Apical Anterior regions-consistent w/ infarct/scar. EKG negative for ischemia. No ECG changes. No significant ischemia demonstrated.  . Transthoracic echocardiogram  03/25/2009    EF 35-45%, mild-moderate global hypokinesis   No family history on file. History  Substance Use Topics  . Smoking status: Former Smoker    Quit date: 04/03/1953  . Smokeless tobacco: Never Used  . Alcohol Use: No    Review of Systems  Constitutional: Negative for fever, chills, diaphoresis and fatigue.  Respiratory: Positive for shortness of breath. Negative for cough.   Cardiovascular: Positive for chest pain ( tightness). Negative for palpitations and leg swelling.  Gastrointestinal: Negative for nausea and vomiting.  All other systems reviewed and are negative.      Allergies  Review of patient's allergies indicates no known allergies.  Home Medications   No current outpatient prescriptions on file. BP 139/82  Pulse 94  Temp(Src) 98 F (36.7 C) (Oral)  Resp 16  Ht 5\' 8"  (1.727 m)  Wt 184 lb 3.2 oz (83.553 kg)  BMI 28.01 kg/m2  SpO2 98% Physical Exam  Nursing note and vitals reviewed. Constitutional: He appears well-developed and well-nourished.  Pt sitting up in exam bed, appears well. NAD  HENT:  Head: Normocephalic and atraumatic.  Eyes: Conjunctivae are normal. No scleral icterus.  Neck: Normal range of motion.  Cardiovascular: Normal rate, regular rhythm and normal heart  sounds.   Pulmonary/Chest: Effort normal and breath sounds normal. No respiratory distress. He has no wheezes. He has no rales. He exhibits no tenderness.  No respiratory distress, able to speak in full sentences w/o difficulty. Lungs: CTAB  Abdominal: Soft. Bowel sounds are normal. He exhibits no distension and no mass. There is no tenderness. There is no rebound and no guarding.  Musculoskeletal: Normal range of motion.   Neurological: He is alert.  Skin: Skin is warm and dry.    ED Course  Procedures (including critical care time) Labs Review Labs Reviewed  CBC - Abnormal; Notable for the following:    Platelets 122 (*)    All other components within normal limits  BASIC METABOLIC PANEL - Abnormal; Notable for the following:    Glucose, Bld 121 (*)    BUN 32 (*)    GFR calc non Af Amer 75 (*)    GFR calc Af Amer 87 (*)    All other components within normal limits  GLUCOSE, CAPILLARY - Abnormal; Notable for the following:    Glucose-Capillary 103 (*)    All other components within normal limits  TROPONIN I  APTT  PROTIME-INR  HEPARIN LEVEL (UNFRACTIONATED)  HEMOGLOBIN A1C  HEMOGLOBIN A1C  I-STAT TROPOININ, ED   Imaging Review Dg Chest Port 1 View  08/27/2013   CLINICAL DATA:  Chest tightness.  Shortness of breath.  EXAM: PORTABLE CHEST - 1 VIEW  COMPARISON:  None.  FINDINGS: There is cardiomegaly without edema. Lungs clear. No pneumothorax or pleural effusion.  IMPRESSION: Cardiomegaly without acute disease.   Electronically Signed   By: Inge Rise M.D.   On: 08/27/2013 05:41     EKG Interpretation   Date/Time:  Tuesday August 27 2013 04:57:15 EDT Ventricular Rate:  71 PR Interval:  290 QRS Duration: 91 QT Interval:  394 QTC Calculation: 428 R Axis:   22 Text Interpretation:  Sinus rhythm with first degree av block Ventricular  premature complex Anterior infarct, old Confirmed by DELOS  MD, DOUGLAS  ([id number]) on 08/27/2013 6:13:45 AM      MDM   Final diagnoses:  Unstable angina    Pt is an 80yo male with known CAD and 2 cardiac stents presenting to ED after episode of chest tightness and SOB.  Pt denies hx of similar symptoms.  According to cardiology medical records from 03/2013, pt did not have anginal symptoms at this time.   Cardiac workup in ED unremarkable. Negative troponin. EKG consistent with previous. CXR: unremarkable.  Not concerned for PE, pneumonia, or  pneumothorax.   Discussed pt with Dr. Venora Maples who recommends pt be admitted for further workup of new unstable angina.  Will consult cardiology for them to see pt for new onset unstable angina.    8:33 AM Consulted with Cardiology who agreed to come see pt.   Pt admitted through cardiology.       Noland Fordyce, PA-C 08/27/13 1614

## 2013-08-27 NOTE — Interval H&P Note (Signed)
Cath Lab Visit (complete for each Cath Lab visit)  Clinical Evaluation Leading to the Procedure:   ACS: no  Non-ACS:    Anginal Classification: CCS IV  Anti-ischemic medical therapy: Maximal Therapy (2 or more classes of medications)  Non-Invasive Test Results: No non-invasive testing performed  Prior CABG: No previous CABG      History and Physical Interval Note:  08/27/2013 5:27 PM  [name]  has presented today for surgery, with the diagnosis of cp  The various methods of treatment have been discussed with the patient and family. After consideration of risks, benefits and other options for treatment, the patient has consented to  Procedure(s): LEFT HEART CATHETERIZATION WITH CORONARY ANGIOGRAM (N/A) as a surgical intervention .  The patient's history has been reviewed, patient examined, no change in status, stable for surgery.  I have reviewed the patient's chart and labs.  Questions were answered to the patient's satisfaction.     Costa Jha A

## 2013-08-28 DIAGNOSIS — I200 Unstable angina: Secondary | ICD-10-CM

## 2013-08-28 DIAGNOSIS — I2589 Other forms of chronic ischemic heart disease: Secondary | ICD-10-CM

## 2013-08-28 LAB — CBC
HCT: 46.9 % (ref 39.0–52.0)
Hemoglobin: 15.6 g/dL (ref 13.0–17.0)
MCH: 30.3 pg (ref 26.0–34.0)
MCHC: 33.3 g/dL (ref 30.0–36.0)
MCV: 91.1 fL (ref 78.0–100.0)
PLATELETS: 126 10*3/uL — AB (ref 150–400)
RBC: 5.15 MIL/uL (ref 4.22–5.81)
RDW: 13.4 % (ref 11.5–15.5)
WBC: 6.6 10*3/uL (ref 4.0–10.5)

## 2013-08-28 LAB — GLUCOSE, CAPILLARY
GLUCOSE-CAPILLARY: 93 mg/dL (ref 70–99)
Glucose-Capillary: 147 mg/dL — ABNORMAL HIGH (ref 70–99)
Glucose-Capillary: 109 mg/dL — ABNORMAL HIGH (ref 70–99)

## 2013-08-28 LAB — BASIC METABOLIC PANEL
BUN: 20 mg/dL (ref 6–23)
CALCIUM: 9.2 mg/dL (ref 8.4–10.5)
CO2: 20 meq/L (ref 19–32)
CREATININE: 0.82 mg/dL (ref 0.50–1.35)
Chloride: 106 mEq/L (ref 96–112)
GFR calc Af Amer: >90 mL/min (ref >90)
GFR, EST NON AFRICAN AMERICAN: 78 mL/min — AB (ref >90)
Glucose, Bld: 109 mg/dL — ABNORMAL HIGH (ref 70–99)
Potassium: 4.4 mEq/L (ref 3.7–5.3)
SODIUM: 142 meq/L (ref 137–147)

## 2013-08-28 LAB — LIPID PANEL
CHOLESTEROL: 146 mg/dL (ref 0–200)
HDL: 57 mg/dL (ref >39)
LDL CALC: 64 mg/dL (ref 0–99)
TRIGLYCERIDES: 126 mg/dL (ref <150)
Total CHOL/HDL Ratio: 2.6 RATIO
VLDL: 25 mg/dL (ref 0–40)

## 2013-08-28 LAB — HEMOGLOBIN A1C
Hgb A1c MFr Bld: 5.9 % — ABNORMAL HIGH (ref <5.7)
Mean Plasma Glucose: 123 mg/dL — ABNORMAL HIGH (ref <117)

## 2013-08-28 LAB — POCT ACTIVATED CLOTTING TIME: ACTIVATED CLOTTING TIME: 94 s

## 2013-08-28 MED ORDER — LOSARTAN POTASSIUM 25 MG PO TABS
25.0000 mg | ORAL_TABLET | Freq: Every day | ORAL | Status: DC
  Administered 2013-08-28: 25 mg via ORAL
  Filled 2013-08-28: qty 1

## 2013-08-28 MED ORDER — ISOSORBIDE MONONITRATE ER 30 MG PO TB24: 30.0000 mg | ORAL_TABLET | Freq: Every day | ORAL | Status: DC

## 2013-08-28 MED ORDER — ISOSORBIDE MONONITRATE ER 30 MG PO TB24
30.0000 mg | ORAL_TABLET | Freq: Every day | ORAL | Status: DC
Start: 2013-08-28 — End: 2013-08-28
  Administered 2013-08-28: 30 mg via ORAL
  Filled 2013-08-28: qty 1

## 2013-08-28 MED ORDER — TRAMADOL HCL 50 MG PO TABS
50.0000 mg | ORAL_TABLET | Freq: Once | ORAL | Status: AC
  Administered 2013-08-28: 50 mg via ORAL
  Filled 2013-08-28: qty 1

## 2013-08-28 MED ORDER — LOSARTAN POTASSIUM 25 MG PO TABS: 25.0000 mg | ORAL_TABLET | Freq: Every day | ORAL | Status: DC

## 2013-08-28 NOTE — Progress Notes (Signed)
Subjective: No complaints. Denies further chest pain. Denies groin, flank and back pain.   Objective: Vital signs in last 24 hours: Temp:  [97.5 F (36.4 C)-98.7 F (37.1 C)] 97.7 F (36.5 C) (03/18 1324) Pulse Rate:  [65-94] 75 (03/18 1324) Resp:  [16-18] 18 (03/18 1324) BP: (115-146)/(63-100) 142/95 mmHg (03/18 1324) SpO2:  [94 %-98 %] 98 % (03/18 1324) Weight:  [184 lb 3.2 oz (83.553 kg)] 184 lb 3.2 oz (83.553 kg) (03/17 1451) Last BM Date: 08/27/13  Intake/Output from previous day: 03/17 0701 - 03/18 0700 In: 758.3 [P.O.:240; I.V.:518.3] Out: 950 [Urine:950] Intake/Output this shift:    Medications Current Facility-Administered Medications  Medication Dose Route Frequency Provider Last Rate Last Dose  . 0.9 %  sodium chloride infusion   Intravenous Continuous Jolaine Artist, MD 70 mL/hr at 08/28/13 0522    . aspirin EC tablet 81 mg  81 mg Oral Daily Troy Sine, MD      . atorvastatin (LIPITOR) tablet 40 mg  40 mg Oral q1800 Troy Sine, MD      . heparin injection 5,000 Units  5,000 Units Subcutaneous 3 times per day Troy Sine, MD   5,000 Units at 08/28/13 1334  . insulin aspart (novoLOG) injection 0-15 Units  0-15 Units Subcutaneous TID WC Rande Brunt, NP        PE: General appearance: alert, cooperative and no distress Lungs: clear to auscultation bilaterally Heart: regular rate and rhythm Extremities: no LEE Pulses: 2+ and symmetric Skin: warm and dry  Lab Results:   Recent Labs  08/27/13 0511 08/28/13 0417  WBC 5.8 6.6  HGB 15.0 15.6  HCT 44.4 46.9  PLT 122* 126*   BMET  Recent Labs  08/27/13 0511 08/28/13 0417  NA 141 142  K 4.5 4.4  CL 104 106  CO2 23 20  GLUCOSE 121* 109*  BUN 32* 20  CREATININE 0.90 0.82  CALCIUM 9.5 9.2   PT/INR  Recent Labs  08/27/13 1310  LABPROT 13.2  INR 1.02   Cholesterol  Recent Labs  08/28/13 0417  CHOL 146    Studies/Results:  LHC 08/27/13 IMPRESSION:  Ischemic  cardiomyopathy with an ejection fraction of 35 to less than 40% with residual hypocontractility in this patient status post remote ST segment elevation anterior wall myocardial infarction secondary to previously documented total LAD occlusion.  Smooth 30% distal tapering of the left main coronary artery  Tandem ostial to proximal LAD stents with recurrent percent smooth in-stent restenosis proximal to the diagonal vessel without restenosis of the diagonal vessel and a large LAD system which extends and wraps around the LV apex.  Normal ramus intermediate vessel  No significant disease in the left circumflex with mild 20% smooth narrowing the marginal branch and a large angiographically normal dominant right coronary artery   Assessment/Plan    Principal Problem:   Intermediate coronary syndrome Active Problems:   CAD (coronary artery disease)   Cardiomyopathy, ischemic  Plan: LHC yesterday. Angiographic details listed above. No PCI indicated. Medical therapy was recommended. He denies further chest pain. However, he has ambulated yet. I have asked RN to ambulate to see how he does. His right femoral access site appears stable. He denies groin, flank and low back pain. HR and BP both stable. Will continue on ASA. He is not on a BB. Review of office records indicate that he had to discontinue BB therapy in the past, due to bradycardia and AV conduction disease.  He has an EF of 35-40%. ? adding a low dose ACE-I. Renal function is normal with SCr of 0.82. SBP in the 140s-150s. Lipitor has been ordered, however he states that he is intolerant to Lipitor (myalgias). He takes Livalo at home. He can continue on Livalo, as his most recent Lipid profile is WNL w/ LDL of 64. If ok with ambulation, he can possibly be discharged home today. MD to follow.      LOS: 1 day    Brittainy M. Ladoris Gene 08/28/2013 2:05 PM  I have seen and evaluated the patient this PM along with  Ellen Henri, PA. I agree  with her findings, examination as well as impression recommendations.  LHC with no PCI options / essentially non-obstructive macrovascular disease.   Not on any meds beyond eplerenone -- will add ARB & Imdur.  No BB for brady history.  Will need to ambulate in hall -- if no further pain.  Can likely d/c home later this PM after getting Losartan 25 mg.   Leonie Man, M.D., M.S. Interventional Cardiologist  [place] Pager # [phone] 08/28/2013     \

## 2013-08-28 NOTE — Discharge Summary (Signed)
Physician Discharge Summary  Patient ID: Blake Patterson MRN: [id number] DOB/AGE: [birth year] 80 y.o.  Admit date: 08/27/2013 Discharge date: 08/28/2013  Admission Diagnoses: Unstable Angina  Discharge Diagnoses:  Principal Problem:   Unstable angina Active Problems:   CAD (coronary artery disease)   Cardiomyopathy, ischemic   Discharged Condition: stable  Hospital Course: Blake Patterson is an 80 year old white male, followed by Dr. Sallyanne Kuster. He suffered an anterior wall myocardial infarction in 2010 and underwent stenting of his LAD with tandem 3.5x38 Zeta, and 3.0x12 mm mini vision bare metal stents and rescue POBA of his diagonal vessel. He did develop cardiogenic shock and need for intra-aortic balloon pulsation. He did have an 80% marginal stenosis and 30% RCA stenosis.   He was admitted to [facility] on 08/27/13 with a several week history of dyspnea on exertion, as well as chest tightness. Initial troponins upon arrival to the hospital were negative. Because of worrisome symptoms he was referred for definitive repeat cardiac catheterization. The procedure was performed by Dr. [name], via the right femoral artery. He was noted to have non-obstructive macrovascular disease with no PCI options. EF was estimated at 35-40%. Medical therapy was recommended. He left the cath lab in stable condition. He was continued on ASA. No BB was added due to a history of bradycardia and AV conduction diease. 30 mg of Imdur and 25 mg of Losartan were added. He tolerated the additions well. BP remained stable. He had no further CP or DOE with ambulation. The right femoral acces site remained stable, free from hematoma and bruit. He denied groin, flank and back pain. Renal function also remained stable. He was last seen and examined by Dr. Ellyn Hack.   Consults: None  Significant Diagnostic Studies:   LHC 08/27/13   HEMODYNAMICS:  Central Aorta: 140/77  Left Ventricle: 140/19  ANGIOGRAPHY:  Left main coronary  artery was a large vessel that had distal tapering and narrowing that was smooth a 40-50% prior to trifurcating into an LAD, in remission needed vessel, and left circumflex coronary artery.  Left anterior descending artery had tandem stance extending from the ostium to the proximal third of the vessel with a moderate size septal perforating artery and first diagonal vessel arose from the stented segment. The proximal LAD stented region there was mild in-stent restenosis of 40 to less than 50%. There was TIMI-3 flow. The LAD was otherwise widely patent and extended and wrapped around the LV apex. The vessel gave rise to 2 additional diagonal and several septal perforator arteries.  The ramus intermediate vessel was angiographically normal.  The left circumflex vessel was anatomically normal giving rise to one major marginal vessel. There was mild 20% narrowing in his marginal branch.  The right coronary artery was a large caliber dominant vessel that gave rise to a large PDA, 2 inferior LV branches and a large PLA vessel.  RAO ventriculography revealed LV dilatation with moderate residual hypocontractility involving anterolateral wall. Image was suboptimal but a qualitative ejection fraction estimate was approximately 35 to less than 40%.    Treatments: See Hospital Course  Discharge Exam: Blood pressure 107/68, pulse 69, temperature 97.7 F (36.5 C), temperature source Oral, resp. rate 18, height 5\' 8"  (1.727 m), weight 184 lb 3.2 oz (83.553 kg), SpO2 95.00%.   Disposition:       Discharge Orders   Future Orders Complete By Expires   Diet - low sodium heart healthy  As directed    Discharge instructions  As directed  Comments:     Wait until Friday 08/30/13 to restart Metformin   Driving Restrictions  As directed    Comments:     No driving for 3 days   Increase activity slowly  As directed    Lifting restrictions  As directed    Comments:     No heavy lifting for 3 days        Medication List    STOP taking these medications       eplerenone 25 MG tablet  Commonly known as:  INSPRA      TAKE these medications       aspirin EC 81 MG tablet  Take 81 mg by mouth daily.     CINNAMON PO  Take 1,000 mg by mouth daily.     Coenzyme Q10 200 MG capsule  Take 200 mg by mouth daily.     famotidine 40 MG tablet  Commonly known as:  PEPCID  Take 40 mg by mouth daily.     Fish Oil 1200 MG Caps  Take 1 capsule by mouth daily.     GARLIC PO  Take 1 tablet by mouth daily.     isosorbide mononitrate 30 MG 24 hr tablet  Commonly known as:  IMDUR  Take 1 tablet (30 mg total) by mouth daily.     LIVALO 2 MG Tabs  Generic drug:  Pitavastatin Calcium  Take 1 tablet by mouth every Monday, Wednesday, and Friday.     losartan 25 MG tablet  Commonly known as:  COZAAR  Take 1 tablet (25 mg total) by mouth daily.     metFORMIN 500 MG tablet  Commonly known as:  GLUCOPHAGE  Take 250 mg by mouth daily with breakfast.     pyridOXINE 100 MG tablet  Commonly known as:  VITAMIN B-6  Take 100 mg by mouth daily.     vitamin B-12 1000 MCG tablet  Commonly known as:  CYANOCOBALAMIN  Take 1,000 mcg by mouth daily.       Follow-up Information   Follow up with SIMMONS, BRITTAINY, PA-C. (Follow-up with Dr. Victorino December PA; our office will call you with the appointment)    Specialty:  Cardiology   Contact information:   [address] [place] [address] [phone]      TIME SPENT ON DISCHARGE, INCLUDING PHYSICIAN TIME: >30 MINUTES  Signed: Lyda Jester 08/28/2013, 5:35 PM   I saw and evaluated the patient this PM along with Ellen Henri, PA prior to this her.  I agree with her findings, impression, and discharge recommendations.    LHC with no PCI options / essentially non-obstructive macrovascular disease.   Not on any meds beyond eplerenone -- will add ARB & Imdur. No BB for brady history.   Roughly one hour following his  dose of losartan, he was able to ambulate in hall without difficulty and had no recurrence of chest discomfort.  Discharge was delayed due to the patient not receiving new blood pressure/cardiac medication prior to discharge.  Blake Patterson, M.D., M.S.  Interventional Cardiologist  [place]  Pager # [phone]  08/28/2013

## 2013-09-16 ENCOUNTER — Ambulatory Visit (INDEPENDENT_AMBULATORY_CARE_PROVIDER_SITE_OTHER): Payer: Medicare Other | Admitting: Cardiology

## 2013-09-16 ENCOUNTER — Encounter: Payer: Self-pay | Admitting: Cardiology

## 2013-09-16 VITALS — BP 102/64 | HR 92 | Ht 68.0 in | Wt 188.2 lb

## 2013-09-16 DIAGNOSIS — I2510 Atherosclerotic heart disease of native coronary artery without angina pectoris: Secondary | ICD-10-CM

## 2013-09-16 DIAGNOSIS — E785 Hyperlipidemia, unspecified: Secondary | ICD-10-CM

## 2013-09-16 DIAGNOSIS — I200 Unstable angina: Secondary | ICD-10-CM

## 2013-09-16 DIAGNOSIS — I2589 Other forms of chronic ischemic heart disease: Secondary | ICD-10-CM

## 2013-09-16 DIAGNOSIS — I255 Ischemic cardiomyopathy: Secondary | ICD-10-CM

## 2013-09-16 NOTE — Assessment & Plan Note (Addendum)
Stable. Denies further chest pain. Continue ASA, Nitrate and ARB. No BB due to h/o significant bradycardia.

## 2013-09-16 NOTE — Progress Notes (Signed)
Patient ID: Blake Patterson, male   DOB: 1926-08-12, 80 y.o.   MRN: [id number]    09/16/2013 Blake Patterson   21-Jul-1926  [id number]  Primary Physicia Blake Gravel, MD Primary Cardiologist: Dr. Sallyanne Patterson  Blake Patterson presents to clinic for post-hospital follow up. Details of his cardiac history and recent hospital course are outlined below.   HPI: Mr. Blake Patterson is an 80 year old white male, followed by Dr. Sallyanne Patterson. He suffered an anterior wall myocardial infarction in 2010 and underwent stenting of his LAD with tandem 3.5x38 Zeta, and 3.0x12 mm mini vision bare metal stents and rescue POBA of his diagonal vessel. He did develop cardiogenic shock and need for intra-aortic balloon pulsation. He did have an 80% marginal stenosis and 30% RCA stenosis.   He was admitted to [facility] on 08/27/13 with a several week history of dyspnea on exertion, as well as chest tightness. Initial troponins upon arrival to the hospital were negative. Because of worrisome symptoms he was referred for definitive repeat cardiac catheterization. The procedure was performed by Dr. Claiborne Patterson, via the right femoral artery. He was noted to have non-obstructive macrovascular disease with no PCI options. EF was estimated at 35-40%. Medical therapy was recommended. He left the cath lab in stable condition. He was continued on ASA. No BB was added due to a history of bradycardia and AV conduction diease. 30 mg of Imdur and 25 mg of Losartan were added. He tolerated the additions well. BP remained stable. He had no further CP or DOE with ambulation. The right femoral acces site remained stable, free from hematoma and bruit. He denied groin, flank and back pain. Renal function also remained stable. He was last seen and examined by Dr. Ellyn Patterson, who determined he was stable for discharge home.  He presents to clinic for post hospital f/u. He is accompanied by his wife. He states that he has done well since discharge. He denies any further chest pain or SOB.  He also denies any palpitations, dizziness, fatigue, syncope and near syncope. He has been compliant with his medications.       Current Outpatient Prescriptions  Medication Sig Dispense Refill  . aspirin EC 81 MG tablet Take 81 mg by mouth daily.      Marland Kitchen BESIVANCE 0.6 % SUSP Place 5 drops into the right eye daily.      Marland Kitchen CINNAMON PO Take 1,000 mg by mouth daily.      . Coenzyme Q10 200 MG capsule Take 200 mg by mouth daily.      . DUREZOL 0.05 % EMUL Place 5 drops into the right eye daily.      . famotidine (PEPCID) 40 MG tablet Take 40 mg by mouth daily.      Marland Kitchen GARLIC PO Take 1 tablet by mouth daily.      . isosorbide mononitrate (IMDUR) 30 MG 24 hr tablet Take 1 tablet (30 mg total) by mouth daily.  30 tablet  5  . LIVALO 2 MG TABS Take 1 tablet by mouth every Monday, Wednesday, and Friday.       . losartan (COZAAR) 25 MG tablet Take 1 tablet (25 mg total) by mouth daily.  30 tablet  5  . metFORMIN (GLUCOPHAGE) 500 MG tablet Take 250 mg by mouth daily with breakfast.      . Omega-3 Fatty Acids (FISH OIL) 1200 MG CAPS Take 1 capsule by mouth daily.      Marland Kitchen pyridOXINE (VITAMIN B-6) 100 MG tablet Take 100 mg by mouth  daily.      . vitamin B-12 (CYANOCOBALAMIN) 1000 MCG tablet Take 1,000 mcg by mouth daily.      . traMADol (ULTRAM) 50 MG tablet Take 50 mg by mouth every 6 (six) hours as needed.       No current facility-administered medications for this visit.    No Known Allergies  History   Social History  . Marital Status: Married    Spouse Name: N/A    Number of Children: N/A  . Years of Education: N/A   Occupational History  . Not on file.   Social History Main Topics  . Smoking status: Former Smoker    Quit date: 04/03/1953  . Smokeless tobacco: Never Used  . Alcohol Use: No  . Drug Use: No  . Sexual Activity: Not on file   Other Topics Concern  . Not on file   Social History Narrative  . No narrative on file     Review of Systems: General: negative for  chills, fever, night sweats or weight changes.  Cardiovascular: negative for chest pain, dyspnea on exertion, edema, orthopnea, palpitations, paroxysmal nocturnal dyspnea or shortness of breath Dermatological: negative for rash Respiratory: negative for cough or wheezing Urologic: negative for hematuria Abdominal: negative for nausea, vomiting, diarrhea, bright red blood per rectum, melena, or hematemesis Neurologic: negative for visual changes, syncope, or dizziness All other systems reviewed and are otherwise negative except as noted above.    Blood pressure 102/64, pulse 92, height 5\' 8"  (1.727 m), weight 188 lb 3.2 oz (85.367 kg).  General appearance: alert, cooperative and no distress Neck: no carotid bruit and no JVD Lungs: clear to auscultation bilaterally Heart: regular rate and rhythm, S1, S2 normal, no murmur, click, rub or gallop Extremities: no LEE Pulses: 2+ and symmetric Skin: warm and dry Neurologic: Grossly normal   ASSESSMENT AND PLAN:   CAD (coronary artery disease) Stable. Denies further chest pain. Continue ASA, Nitrate and ARB. No BB due to h/o significant bradycardia.   Hyperlipidemia Well controlled. Continue Livalo.   Cardiomyopathy, ischemic No signs/ symptoms of CHF. Continue ARB. No BB due to h/o significant bradycardia on BB therapy.    PLAN  Pt appears to be doing well post discharge. He denies further chest pain with addition of Imdur. Will continue current medical regimen. Continue regular routine follow-ups with Dr. Sallyanne Patterson.   Blake Patterson, Blake Patterson 09/16/2013 1:49 PM

## 2013-09-16 NOTE — Assessment & Plan Note (Signed)
No signs/ symptoms of CHF. Continue ARB. No BB due to h/o significant bradycardia on BB therapy.

## 2013-09-16 NOTE — Assessment & Plan Note (Signed)
Well controlled. Continue Livalo.

## 2013-09-16 NOTE — Patient Instructions (Signed)
Continue medications as directed.  Your physician recommends that you Keep your scheduled appointment with Dr. Sallyanne Kuster in October.

## 2014-02-07 ENCOUNTER — Telehealth: Payer: Self-pay | Admitting: Cardiovascular Disease

## 2014-02-07 ENCOUNTER — Encounter: Payer: Self-pay | Admitting: Cardiovascular Disease

## 2014-02-07 NOTE — Telephone Encounter (Signed)
Stanton Kidney called in stating that the pt is going to need shoulder replacement surgery and a form was sent over from [place] on 8/13 to see if he will need medical clearance first. His surgery will not be scheduled until he is contacted by our office . Please call  Thanks

## 2014-02-07 NOTE — Telephone Encounter (Signed)
Letter routed in EPIC by Dr. Sallyanne Kuster to Dr. Onnie Graham.   Called [facility] and got the fax for Abigail Butts, surgical scheduler for Dr. Onnie Graham from operator.   Clearance letter faxed to [phone]  Patient/wife notified of low risk for surgery

## 2014-02-07 NOTE — Telephone Encounter (Signed)
I typed up a letter and sent electronically to Dr. Onnie Graham. Please make sure they got it. Thanks

## 2014-02-07 NOTE — Telephone Encounter (Signed)
Returned call to patient's wife. Informed her I could not locate a clearance form from [place] (Dr. Onnie Graham) requesting clearance for shoulder surgery, nor could I see where this may have been faxed already. Informed wife that Dr. Sallyanne Kuster and his nurse would be notified of need for clearance.

## 2014-02-14 ENCOUNTER — Encounter (HOSPITAL_COMMUNITY): Payer: Self-pay | Admitting: Pharmacy Technician

## 2014-02-18 ENCOUNTER — Encounter (HOSPITAL_COMMUNITY): Payer: Self-pay

## 2014-02-18 ENCOUNTER — Encounter (HOSPITAL_COMMUNITY)
Admission: RE | Admit: 2014-02-18 | Discharge: 2014-02-18 | Disposition: A | Discharge status: Home / Self Care | Payer: Medicare Other | Source: Ambulatory Visit | Attending: Orthopedic Surgery | Admitting: Orthopedic Surgery

## 2014-02-18 ENCOUNTER — Ambulatory Visit (HOSPITAL_COMMUNITY)
Admission: RE | Admit: 2014-02-18 | Discharge: 2014-02-18 | Disposition: A | Discharge status: Home / Self Care | Payer: Medicare Other | Source: Ambulatory Visit | Attending: Orthopedic Surgery | Admitting: Orthopedic Surgery

## 2014-02-18 DIAGNOSIS — Z01818 Encounter for other preprocedural examination: Secondary | ICD-10-CM

## 2014-02-18 HISTORY — DX: Unspecified osteoarthritis, unspecified site: M19.90

## 2014-02-18 HISTORY — DX: Type 2 diabetes mellitus without complications: E11.9

## 2014-02-18 LAB — COMPREHENSIVE METABOLIC PANEL
ALBUMIN: 3.9 g/dL (ref 3.5–5.2)
ALT: 23 U/L (ref 0–53)
AST: 25 U/L (ref 0–37)
Alkaline Phosphatase: 76 U/L (ref 39–117)
Anion gap: 14 (ref 5–15)
BUN: 30 mg/dL — ABNORMAL HIGH (ref 6–23)
CALCIUM: 9.3 mg/dL (ref 8.4–10.5)
CO2: 22 mEq/L (ref 19–32)
Chloride: 108 mEq/L (ref 96–112)
Creatinine, Ser: 1.19 mg/dL (ref 0.50–1.35)
GFR calc Af Amer: 62 mL/min — ABNORMAL LOW (ref >90)
GFR calc non Af Amer: 54 mL/min — ABNORMAL LOW (ref >90)
Glucose, Bld: 115 mg/dL — ABNORMAL HIGH (ref 70–99)
Potassium: 4.3 mEq/L (ref 3.7–5.3)
SODIUM: 144 meq/L (ref 137–147)
TOTAL PROTEIN: 6.9 g/dL (ref 6.0–8.3)
Total Bilirubin: 0.4 mg/dL (ref 0.3–1.2)

## 2014-02-18 LAB — PROTIME-INR
INR: 1.06 (ref 0.00–1.49)
Prothrombin Time: 13.8 seconds (ref 11.6–15.2)

## 2014-02-18 LAB — CBC WITH DIFFERENTIAL/PLATELET
BASOS PCT: 1 % (ref 0–1)
Basophils Absolute: 0 10*3/uL (ref 0.0–0.1)
EOS ABS: 0.3 10*3/uL (ref 0.0–0.7)
EOS PCT: 5 % (ref 0–5)
HCT: 43.7 % (ref 39.0–52.0)
Hemoglobin: 13.9 g/dL (ref 13.0–17.0)
LYMPHS PCT: 24 % (ref 12–46)
Lymphs Abs: 1.3 10*3/uL (ref 0.7–4.0)
MCH: 29.3 pg (ref 26.0–34.0)
MCHC: 31.8 g/dL (ref 30.0–36.0)
MCV: 92.2 fL (ref 78.0–100.0)
Monocytes Absolute: 0.4 10*3/uL (ref 0.1–1.0)
Monocytes Relative: 7 % (ref 3–12)
Neutro Abs: 3.5 10*3/uL (ref 1.7–7.7)
Neutrophils Relative %: 63 % (ref 43–77)
PLATELETS: 147 10*3/uL — AB (ref 150–400)
RBC: 4.74 MIL/uL (ref 4.22–5.81)
RDW: 14.1 % (ref 11.5–15.5)
WBC: 5.5 10*3/uL (ref 4.0–10.5)

## 2014-02-18 LAB — TYPE AND SCREEN
ABO/RH(D): A POS
ANTIBODY SCREEN: NEGATIVE

## 2014-02-18 LAB — APTT: aPTT: 28 seconds (ref 24–37)

## 2014-02-18 NOTE — Pre-Procedure Instructions (Signed)
Blake Patterson  02/18/2014   Your procedure is scheduled on:  02/20/14  Report to [facility] Admitting at 8 AM.  Call this number if you have problems the morning of surgery: [phone]   Remember:   Do not eat food or drink liquids after midnight.   Take these medicines the morning of surgery with A SIP OF WATER: imdur,pepcid   Do not wear jewelry, make-up or nail polish.  Do not wear lotions, powders, or perfumes. You may wear deodorant.  Do not shave 48 hours prior to surgery. Men may shave face and neck.  Do not bring valuables to the hospital.  [facility] is not responsible                  for any belongings or valuables.               Contacts, dentures or bridgework may not be worn into surgery.  Leave suitcase in the car. After surgery it may be brought to your room.  For patients admitted to the hospital, discharge time is determined by your                treatment team.               Patients discharged the day of surgery will not be allowed to drive  home.  Name and phone number of your driver: family  Special Instructions: Shower using CHG 2 nights before surgery and the night before surgery.  If you shower the day of surgery use CHG.  Use special wash - you have one bottle of CHG for all showers.  You should use approximately 1/3 of the bottle for each shower.   Please read over the following fact sheets that you were given: Pain Booklet, Coughing and Deep Breathing, Blood Transfusion Information and Surgical Site Infection Prevention

## 2014-02-19 LAB — ABO/RH: ABO/RH(D): A POS

## 2014-02-19 MED ORDER — CHLORHEXIDINE GLUCONATE 4 % EX LIQD
60.0000 mL | Freq: Once | CUTANEOUS | Status: DC
  Filled 2014-02-19: qty 60

## 2014-02-19 MED ORDER — CEFAZOLIN SODIUM-DEXTROSE 2-3 GM-% IV SOLR
2.0000 g | INTRAVENOUS | Status: AC
  Administered 2014-02-20: 2 g via INTRAVENOUS
  Filled 2014-02-19: qty 50

## 2014-02-19 NOTE — Progress Notes (Signed)
Anesthesia Chart Review:  Patient is a 80 year old male scheduled for right total shoulder arthroplasty on 02/20/14 by Dr. Onnie Graham.  History includes CAD/MI '10 s/p LAD BMS and rescue POBA of his diagonal (OM not approached at that time due to contrast nephropathy in the setting of cardiogenic shock and need for IABP), ischemic cardiomyopathy, cardiac conduction disorder (not specified; known first degree AVB), cannot exclude LV apical thrombus on 11/2008 echo, DM2, arthritis, skin cancer.  Cardiologist is Dr. Sallyanne Kuster who cleared patient with low CV risk. PCP is Dr. Jani Gravel.  EKG on 08/27/13 showed SR, first degree AVB, occasional PVC, anterior infarct (age undetermined).  Cardiac cath on 08/27/13 showed: Ischemic cardiomyopathy with an ejection fraction of 35 to less than 40% with residual hypocontractility in this patient status post remote ST segment elevation anterior wall myocardial infarction secondary to previously documented total LAD occlusion.  Smooth 30% distal tapering of the left main coronary artery  Tandem ostial to proximal LAD stents with recurrent percent smooth in-stent restenosis (40 - < 50%) proximal to the diagonal vessel without restenosis of the diagonal vessel and a large LAD system which extends and wraps around the LV apex.  Normal ramus intermediate vessel. No significant disease in the left circumflex with mild 20% smooth narrowing the marginal branch and a large angiographically normal dominant right coronary artery.  RECOMMENDATION:  Medical therapy  Last stress and echo were in 2010.   Preoperative CXR and labs noted.   Patient was cleared by his cardiologist, so if no acute changes then I would anticipate that he could proceed as planned.  George Hugh [facility] Short Stay Center/Anesthesiology Phone [phone] 02/19/2014 5:41 PM

## 2014-02-19 NOTE — Progress Notes (Signed)
Pt notified of time change;to arrive at 0530-verbalized understanding 

## 2014-02-20 ENCOUNTER — Inpatient Hospital Stay (HOSPITAL_COMMUNITY): Payer: Medicare Other | Admitting: Anesthesiology

## 2014-02-20 ENCOUNTER — Encounter (HOSPITAL_COMMUNITY)
Admission: RE | Disposition: A | Discharge status: Home / Self Care | Payer: Self-pay | Source: Ambulatory Visit | Attending: Orthopedic Surgery

## 2014-02-20 ENCOUNTER — Encounter (HOSPITAL_COMMUNITY): Payer: Medicare Other | Admitting: Vascular Surgery

## 2014-02-20 ENCOUNTER — Encounter (HOSPITAL_COMMUNITY): Payer: Self-pay | Admitting: *Deleted

## 2014-02-20 ENCOUNTER — Inpatient Hospital Stay (HOSPITAL_COMMUNITY)
Admission: RE | Admit: 2014-02-20 | Discharge: 2014-02-21 | DRG: 483 | Disposition: A | Discharge status: Home / Self Care | Payer: Medicare Other | Source: Ambulatory Visit | Attending: Orthopedic Surgery | Admitting: Orthopedic Surgery

## 2014-02-20 DIAGNOSIS — E785 Hyperlipidemia, unspecified: Secondary | ICD-10-CM | POA: Diagnosis present

## 2014-02-20 DIAGNOSIS — I959 Hypotension, unspecified: Secondary | ICD-10-CM | POA: Diagnosis not present

## 2014-02-20 DIAGNOSIS — Z9861 Coronary angioplasty status: Secondary | ICD-10-CM

## 2014-02-20 DIAGNOSIS — Z885 Allergy status to narcotic agent status: Secondary | ICD-10-CM

## 2014-02-20 DIAGNOSIS — I2589 Other forms of chronic ischemic heart disease: Secondary | ICD-10-CM | POA: Diagnosis present

## 2014-02-20 DIAGNOSIS — I2510 Atherosclerotic heart disease of native coronary artery without angina pectoris: Secondary | ICD-10-CM | POA: Diagnosis present

## 2014-02-20 DIAGNOSIS — Z87891 Personal history of nicotine dependence: Secondary | ICD-10-CM

## 2014-02-20 DIAGNOSIS — E119 Type 2 diabetes mellitus without complications: Secondary | ICD-10-CM | POA: Diagnosis present

## 2014-02-20 DIAGNOSIS — I252 Old myocardial infarction: Secondary | ICD-10-CM | POA: Diagnosis not present

## 2014-02-20 DIAGNOSIS — Z96659 Presence of unspecified artificial knee joint: Secondary | ICD-10-CM

## 2014-02-20 DIAGNOSIS — M19019 Primary osteoarthritis, unspecified shoulder: Principal | ICD-10-CM | POA: Diagnosis present

## 2014-02-20 DIAGNOSIS — Z96619 Presence of unspecified artificial shoulder joint: Secondary | ICD-10-CM

## 2014-02-20 HISTORY — PX: TOTAL SHOULDER ARTHROPLASTY: SHX126

## 2014-02-20 LAB — GLUCOSE, CAPILLARY
GLUCOSE-CAPILLARY: 97 mg/dL (ref 70–99)
GLUCOSE-CAPILLARY: 137 mg/dL — AB (ref 70–99)
Glucose-Capillary: 88 mg/dL (ref 70–99)
Glucose-Capillary: 124 mg/dL — ABNORMAL HIGH (ref 70–99)
Glucose-Capillary: 150 mg/dL — ABNORMAL HIGH (ref 70–99)

## 2014-02-20 SURGERY — ARTHROPLASTY, SHOULDER, TOTAL
Anesthesia: General | Site: Shoulder | Laterality: Right

## 2014-02-20 MED ORDER — WHITE PETROLATUM GEL
Status: AC
  Administered 2014-02-20: 18:00:00
  Filled 2014-02-20: qty 5

## 2014-02-20 MED ORDER — GLYCOPYRROLATE 0.2 MG/ML IJ SOLN
INTRAMUSCULAR | Status: DC | PRN
  Administered 2014-02-20: 0.4 mg via INTRAVENOUS

## 2014-02-20 MED ORDER — ROCURONIUM BROMIDE 50 MG/5ML IV SOLN
INTRAVENOUS | Status: AC
  Filled 2014-02-20: qty 1

## 2014-02-20 MED ORDER — SODIUM CHLORIDE 0.9 % IJ SOLN
INTRAMUSCULAR | Status: AC
  Filled 2014-02-20: qty 10

## 2014-02-20 MED ORDER — PROPOFOL 10 MG/ML IV BOLUS
INTRAVENOUS | Status: DC | PRN
  Administered 2014-02-20: 100 mg via INTRAVENOUS

## 2014-02-20 MED ORDER — POLYETHYLENE GLYCOL 3350 17 G PO PACK: 17.0000 g | PACK | Freq: Every day | ORAL | Status: DC | PRN

## 2014-02-20 MED ORDER — LACTATED RINGERS IV SOLN: INTRAVENOUS | Status: DC

## 2014-02-20 MED ORDER — LOSARTAN POTASSIUM 25 MG PO TABS
25.0000 mg | ORAL_TABLET | Freq: Every day | ORAL | Status: DC
  Filled 2014-02-20 (×2): qty 1

## 2014-02-20 MED ORDER — HYDROMORPHONE HCL PF 1 MG/ML IJ SOLN: 0.2500 mg | INTRAMUSCULAR | Status: DC | PRN

## 2014-02-20 MED ORDER — FAMOTIDINE 40 MG PO TABS
40.0000 mg | ORAL_TABLET | Freq: Every day | ORAL | Status: DC
  Administered 2014-02-20 – 2014-02-21 (×2): 40 mg via ORAL
  Filled 2014-02-20 (×2): qty 1

## 2014-02-20 MED ORDER — ASPIRIN EC 81 MG PO TBEC
81.0000 mg | DELAYED_RELEASE_TABLET | Freq: Every day | ORAL | Status: DC
  Administered 2014-02-20 – 2014-02-21 (×2): 81 mg via ORAL
  Filled 2014-02-20 (×2): qty 1

## 2014-02-20 MED ORDER — LACTATED RINGERS IV SOLN
INTRAVENOUS | Status: DC
  Administered 2014-02-20 – 2014-02-21 (×2): via INTRAVENOUS

## 2014-02-20 MED ORDER — FENTANYL CITRATE 0.05 MG/ML IJ SOLN
INTRAMUSCULAR | Status: DC | PRN
  Administered 2014-02-20: 100 ug via INTRAVENOUS
  Administered 2014-02-20: 50 ug via INTRAVENOUS

## 2014-02-20 MED ORDER — OXYCODONE HCL 5 MG/5ML PO SOLN: 5.0000 mg | Freq: Once | ORAL | Status: DC | PRN

## 2014-02-20 MED ORDER — ONDANSETRON HCL 4 MG/2ML IJ SOLN: 4.0000 mg | Freq: Four times a day (QID) | INTRAMUSCULAR | Status: DC | PRN

## 2014-02-20 MED ORDER — ISOSORBIDE MONONITRATE ER 30 MG PO TB24
30.0000 mg | ORAL_TABLET | Freq: Every day | ORAL | Status: DC
  Administered 2014-02-21: 30 mg via ORAL
  Filled 2014-02-20 (×2): qty 1

## 2014-02-20 MED ORDER — CEFAZOLIN SODIUM 1-5 GM-% IV SOLN
1.0000 g | Freq: Four times a day (QID) | INTRAVENOUS | Status: AC
  Administered 2014-02-20 – 2014-02-21 (×3): 1 g via INTRAVENOUS
  Filled 2014-02-20 (×3): qty 50

## 2014-02-20 MED ORDER — ONDANSETRON HCL 4 MG/2ML IJ SOLN: 4.0000 mg | Freq: Once | INTRAMUSCULAR | Status: DC | PRN

## 2014-02-20 MED ORDER — HYDROMORPHONE HCL PF 1 MG/ML IJ SOLN
INTRAMUSCULAR | Status: AC
  Filled 2014-02-20: qty 1

## 2014-02-20 MED ORDER — ACETAMINOPHEN 650 MG RE SUPP: 650.0000 mg | Freq: Four times a day (QID) | RECTAL | Status: DC | PRN

## 2014-02-20 MED ORDER — ONDANSETRON HCL 4 MG PO TABS: 4.0000 mg | ORAL_TABLET | Freq: Four times a day (QID) | ORAL | Status: DC | PRN

## 2014-02-20 MED ORDER — GLYCOPYRROLATE 0.2 MG/ML IJ SOLN
INTRAMUSCULAR | Status: AC
  Filled 2014-02-20: qty 2

## 2014-02-20 MED ORDER — DIPHENHYDRAMINE HCL 12.5 MG/5ML PO ELIX: 12.5000 mg | ORAL_SOLUTION | ORAL | Status: DC | PRN

## 2014-02-20 MED ORDER — MEPERIDINE HCL 25 MG/ML IJ SOLN: 6.2500 mg | INTRAMUSCULAR | Status: DC | PRN

## 2014-02-20 MED ORDER — LACTATED RINGERS IV SOLN
INTRAVENOUS | Status: DC | PRN
  Administered 2014-02-20 (×2): via INTRAVENOUS

## 2014-02-20 MED ORDER — PROPOFOL 10 MG/ML IV BOLUS
INTRAVENOUS | Status: AC
  Filled 2014-02-20: qty 20

## 2014-02-20 MED ORDER — EPHEDRINE SULFATE 50 MG/ML IJ SOLN
INTRAMUSCULAR | Status: AC
  Filled 2014-02-20: qty 1

## 2014-02-20 MED ORDER — MIDAZOLAM HCL 5 MG/5ML IJ SOLN
INTRAMUSCULAR | Status: DC | PRN
  Administered 2014-02-20: 1 mg via INTRAVENOUS

## 2014-02-20 MED ORDER — ROCURONIUM BROMIDE 100 MG/10ML IV SOLN
INTRAVENOUS | Status: DC | PRN
  Administered 2014-02-20: 50 mg via INTRAVENOUS

## 2014-02-20 MED ORDER — HYDROMORPHONE HCL 2 MG PO TABS: 2.0000 mg | ORAL_TABLET | ORAL | Status: DC | PRN

## 2014-02-20 MED ORDER — METOCLOPRAMIDE HCL 10 MG PO TABS
5.0000 mg | ORAL_TABLET | Freq: Three times a day (TID) | ORAL | Status: DC | PRN
Start: 2014-02-20 — End: 2014-02-21

## 2014-02-20 MED ORDER — FLEET ENEMA 7-19 GM/118ML RE ENEM: 1.0000 | ENEMA | Freq: Once | RECTAL | Status: AC | PRN

## 2014-02-20 MED ORDER — BISACODYL 5 MG PO TBEC: 5.0000 mg | DELAYED_RELEASE_TABLET | Freq: Every day | ORAL | Status: DC | PRN

## 2014-02-20 MED ORDER — ONDANSETRON HCL 4 MG/2ML IJ SOLN
INTRAMUSCULAR | Status: AC
  Filled 2014-02-20: qty 2

## 2014-02-20 MED ORDER — TRAMADOL HCL 50 MG PO TABS: 50.0000 mg | ORAL_TABLET | Freq: Four times a day (QID) | ORAL | Status: DC | PRN

## 2014-02-20 MED ORDER — FENTANYL CITRATE 0.05 MG/ML IJ SOLN
INTRAMUSCULAR | Status: AC
  Filled 2014-02-20: qty 5

## 2014-02-20 MED ORDER — OXYCODONE HCL 5 MG PO TABS: 5.0000 mg | ORAL_TABLET | Freq: Once | ORAL | Status: DC | PRN

## 2014-02-20 MED ORDER — NEOSTIGMINE METHYLSULFATE 10 MG/10ML IV SOLN
INTRAVENOUS | Status: DC | PRN
  Administered 2014-02-20: 3 mg via INTRAVENOUS

## 2014-02-20 MED ORDER — MENTHOL 3 MG MT LOZG: 1.0000 | LOZENGE | OROMUCOSAL | Status: DC | PRN

## 2014-02-20 MED ORDER — HYDROMORPHONE HCL 2 MG PO TABS
2.0000 mg | ORAL_TABLET | ORAL | Status: DC | PRN
  Administered 2014-02-20: 2 mg via ORAL
  Filled 2014-02-20 (×2): qty 1

## 2014-02-20 MED ORDER — SODIUM CHLORIDE 0.9 % IV SOLN
10.0000 mg | INTRAVENOUS | Status: DC | PRN
  Administered 2014-02-20: 10 ug/min via INTRAVENOUS

## 2014-02-20 MED ORDER — 0.9 % SODIUM CHLORIDE (POUR BTL) OPTIME
TOPICAL | Status: DC | PRN
  Administered 2014-02-20: 1000 mL

## 2014-02-20 MED ORDER — TRAMADOL HCL 50 MG PO TABS
50.0000 mg | ORAL_TABLET | ORAL | Status: DC | PRN
Start: 2014-02-20 — End: 2014-02-21
  Administered 2014-02-20 – 2014-02-21 (×4): 50 mg via ORAL
  Filled 2014-02-20 (×4): qty 1

## 2014-02-20 MED ORDER — ALUM & MAG HYDROXIDE-SIMETH 200-200-20 MG/5ML PO SUSP: 30.0000 mL | ORAL | Status: DC | PRN

## 2014-02-20 MED ORDER — EPHEDRINE SULFATE 50 MG/ML IJ SOLN
INTRAMUSCULAR | Status: DC | PRN
  Administered 2014-02-20 (×4): 5 mg via INTRAVENOUS
  Administered 2014-02-20: 10 mg via INTRAVENOUS

## 2014-02-20 MED ORDER — DIAZEPAM 5 MG PO TABS: 2.5000 mg | ORAL_TABLET | Freq: Four times a day (QID) | ORAL | Status: DC | PRN

## 2014-02-20 MED ORDER — LIDOCAINE HCL (CARDIAC) 20 MG/ML IV SOLN
INTRAVENOUS | Status: DC | PRN
  Administered 2014-02-20: 100 mg via INTRAVENOUS

## 2014-02-20 MED ORDER — MIDAZOLAM HCL 2 MG/2ML IJ SOLN
INTRAMUSCULAR | Status: AC
  Filled 2014-02-20: qty 2

## 2014-02-20 MED ORDER — BUPIVACAINE-EPINEPHRINE (PF) 0.5% -1:200000 IJ SOLN
INTRAMUSCULAR | Status: DC | PRN
  Administered 2014-02-20: 30 mL via PERINEURAL

## 2014-02-20 MED ORDER — DOCUSATE SODIUM 100 MG PO CAPS
100.0000 mg | ORAL_CAPSULE | Freq: Two times a day (BID) | ORAL | Status: DC
  Administered 2014-02-20 – 2014-02-21 (×3): 100 mg via ORAL
  Filled 2014-02-20 (×4): qty 1

## 2014-02-20 MED ORDER — NEOSTIGMINE METHYLSULFATE 10 MG/10ML IV SOLN
INTRAVENOUS | Status: AC
  Filled 2014-02-20: qty 1

## 2014-02-20 MED ORDER — PHENOL 1.4 % MT LIQD: 1.0000 | OROMUCOSAL | Status: DC | PRN

## 2014-02-20 MED ORDER — LIDOCAINE HCL (CARDIAC) 20 MG/ML IV SOLN
INTRAVENOUS | Status: AC
  Filled 2014-02-20: qty 5

## 2014-02-20 MED ORDER — METOCLOPRAMIDE HCL 5 MG/ML IJ SOLN: 5.0000 mg | Freq: Three times a day (TID) | INTRAMUSCULAR | Status: DC | PRN

## 2014-02-20 MED ORDER — SUCCINYLCHOLINE CHLORIDE 20 MG/ML IJ SOLN
INTRAMUSCULAR | Status: AC
  Filled 2014-02-20: qty 1

## 2014-02-20 MED ORDER — ONDANSETRON HCL 4 MG/2ML IJ SOLN
INTRAMUSCULAR | Status: DC | PRN
  Administered 2014-02-20: 4 mg via INTRAVENOUS

## 2014-02-20 MED ORDER — ACETAMINOPHEN 325 MG PO TABS
650.0000 mg | ORAL_TABLET | Freq: Four times a day (QID) | ORAL | Status: DC | PRN
  Administered 2014-02-20 – 2014-02-21 (×2): 650 mg via ORAL
  Filled 2014-02-20 (×2): qty 2

## 2014-02-20 MED ORDER — HYDROMORPHONE HCL PF 1 MG/ML IJ SOLN
0.5000 mg | INTRAMUSCULAR | Status: DC | PRN
  Administered 2014-02-20 (×2): 0.5 mg via INTRAVENOUS

## 2014-02-20 SURGICAL SUPPLY — 66 items
BLADE SAW SGTL 83.5X18.5 (BLADE) ×3 IMPLANT
BODY PROX ANATOMIC 16-135 SHLD (Shoulder) ×3 IMPLANT
CEMENT BONE DEPUY (Cement) ×3 IMPLANT
CLOSURE WOUND 1/2 X4 (GAUZE/BANDAGES/DRESSINGS)
COVER SURGICAL LIGHT HANDLE (MISCELLANEOUS) ×3 IMPLANT
DERMABOND ADVANCED (GAUZE/BANDAGES/DRESSINGS) ×2
DERMABOND ADVANCED .7 DNX12 (GAUZE/BANDAGES/DRESSINGS) ×1 IMPLANT
DRAPE INCISE IOBAN 66X45 STRL (DRAPES) ×3 IMPLANT
DRAPE SURG 17X11 SM STRL (DRAPES) IMPLANT
DRAPE SURG 17X23 STRL (DRAPES) ×3 IMPLANT
DRAPE U-SHAPE 47X51 STRL (DRAPES) ×3 IMPLANT
DRILL BIT 7/64X5 (BIT) IMPLANT
DRSG AQUACEL AG ADV 3.5X10 (GAUZE/BANDAGES/DRESSINGS) ×3 IMPLANT
DRSG MEPILEX BORDER 4X4 (GAUZE/BANDAGES/DRESSINGS) IMPLANT
DRSG MEPILEX BORDER 4X8 (GAUZE/BANDAGES/DRESSINGS) IMPLANT
DURAPREP 26ML APPLICATOR (WOUND CARE) ×6 IMPLANT
ELECT BLADE 4.0 EZ CLEAN MEGAD (MISCELLANEOUS) ×3
ELECT CAUTERY BLADE 6.4 (BLADE) ×3 IMPLANT
ELECT REM PT RETURN 9FT ADLT (ELECTROSURGICAL) ×3
ELECTRODE BLDE 4.0 EZ CLN MEGD (MISCELLANEOUS) ×1 IMPLANT
ELECTRODE REM PT RTRN 9FT ADLT (ELECTROSURGICAL) ×1 IMPLANT
FACESHIELD WRAPAROUND (MASK) ×12 IMPLANT
GLENOID ANCHOR PEG CROSSLK 48 (Orthopedic Implant) ×3 IMPLANT
GLOVE BIO SURGEON STRL SZ7.5 (GLOVE) ×3 IMPLANT
GLOVE BIO SURGEON STRL SZ8 (GLOVE) ×3 IMPLANT
GLOVE EUDERMIC 7 POWDERFREE (GLOVE) ×3 IMPLANT
GLOVE SS BIOGEL STRL SZ 7.5 (GLOVE) ×1 IMPLANT
GLOVE SUPERSENSE BIOGEL SZ 7.5 (GLOVE) ×2
GOWN STRL REUS W/ TWL LRG LVL3 (GOWN DISPOSABLE) ×1 IMPLANT
GOWN STRL REUS W/ TWL XL LVL3 (GOWN DISPOSABLE) ×3 IMPLANT
GOWN STRL REUS W/TWL LRG LVL3 (GOWN DISPOSABLE) ×2
GOWN STRL REUS W/TWL XL LVL3 (GOWN DISPOSABLE) ×6
HEAD HUMERAL ECC 52X18MM (Head) ×3 IMPLANT
KIT BASIN OR (CUSTOM PROCEDURE TRAY) ×3 IMPLANT
KIT ROOM TURNOVER OR (KITS) ×3 IMPLANT
MANIFOLD NEPTUNE II (INSTRUMENTS) ×3 IMPLANT
MARKER SKIN DUAL TIP RULER LAB (MISCELLANEOUS) ×3 IMPLANT
NDL SUT 6 .5 CRC .975X.05 MAYO (NEEDLE) IMPLANT
NEEDLE HYPO 25GX1X1/2 BEV (NEEDLE) IMPLANT
NEEDLE MAYO TAPER (NEEDLE)
NS IRRIG 1000ML POUR BTL (IV SOLUTION) ×3 IMPLANT
PACK SHOULDER (CUSTOM PROCEDURE TRAY) ×3 IMPLANT
PAD ARMBOARD 7.5X6 YLW CONV (MISCELLANEOUS) ×6 IMPLANT
PASSER SUT SWANSON 36MM LOOP (INSTRUMENTS) IMPLANT
PIN METAGLENE 2.5 (PIN) ×3 IMPLANT
SLING ARM LRG ADULT FOAM STRAP (SOFTGOODS) IMPLANT
SLING ARM XL FOAM STRAP (SOFTGOODS) IMPLANT
SMARTMIX MINI TOWER (MISCELLANEOUS) ×3
SPONGE LAP 18X18 X RAY DECT (DISPOSABLE) ×3 IMPLANT
SPONGE LAP 4X18 X RAY DECT (DISPOSABLE) ×3 IMPLANT
STEM STND PRORCOAT 137MM SHLD (Shoulder) ×3 IMPLANT
STRIP CLOSURE SKIN 1/2X4 (GAUZE/BANDAGES/DRESSINGS) IMPLANT
SUCTION FRAZIER TIP 10 FR DISP (SUCTIONS) ×3 IMPLANT
SUT BONE WAX W31G (SUTURE) IMPLANT
SUT FIBERWIRE #2 38 T-5 BLUE (SUTURE) ×12
SUT MNCRL AB 3-0 PS2 18 (SUTURE) ×3 IMPLANT
SUT VIC AB 1 CT1 27 (SUTURE) ×2
SUT VIC AB 1 CT1 27XBRD ANBCTR (SUTURE) ×1 IMPLANT
SUT VIC AB 2-0 CT1 27 (SUTURE) ×2
SUT VIC AB 2-0 CT1 TAPERPNT 27 (SUTURE) ×1 IMPLANT
SUTURE FIBERWR #2 38 T-5 BLUE (SUTURE) ×4 IMPLANT
SYR CONTROL 10ML LL (SYRINGE) IMPLANT
TOWEL OR 17X24 6PK STRL BLUE (TOWEL DISPOSABLE) ×3 IMPLANT
TOWEL OR 17X26 10 PK STRL BLUE (TOWEL DISPOSABLE) ×3 IMPLANT
TOWER SMARTMIX MINI (MISCELLANEOUS) ×1 IMPLANT
WATER STERILE IRR 1000ML POUR (IV SOLUTION) IMPLANT

## 2014-02-20 NOTE — Progress Notes (Signed)
Utilization review completed.  

## 2014-02-20 NOTE — Anesthesia Postprocedure Evaluation (Signed)
Anesthesia Post Note  Patient: Blake Patterson  Procedure(s) Performed: Procedure(s) (LRB): RIGHT TOTAL SHOULDER ARTHROPLASTY (Right)  Anesthesia type: general  Patient location: PACU  Post pain: Pain level controlled  Post assessment: Patient's Cardiovascular Status Stable  Last Vitals:  Filed Vitals:   02/20/14 1015  BP:   Pulse: 66  Temp:   Resp: 19    Post vital signs: Reviewed and stable  Level of consciousness: sedated  Complications: No apparent anesthesia complications

## 2014-02-20 NOTE — Op Note (Signed)
02/20/2014  9:31 AM  PATIENT:   Blake Patterson  80 y.o. male  PRE-OPERATIVE DIAGNOSIS:  OA OF RIGHT SHOULDER  POST-OPERATIVE DIAGNOSIS:  same  PROCEDURE:  R TSA #16 hybrid stem press fit, 52x18 eccentric head, 48 glenoid  SURGEON:  Jury Caserta, Metta Clines M.D.  ASSISTANTS: Shuford pac   ANESTHESIA:   GET + ISB  EBL: 250cc  SPECIMEN:  none  Drains: none   PATIENT DISPOSITION:  PACU - hemodynamically stable.    PLAN OF CARE: Admit to inpatient   Dictation# [id number]

## 2014-02-20 NOTE — Anesthesia Preprocedure Evaluation (Addendum)
Anesthesia Evaluation  Patient identified by MRN, date of birth, ID band Patient awake    Reviewed: Allergy & Precautions, H&P , NPO status , Patient's Chart, lab work & pertinent test results  Airway Mallampati: I TM Distance: >3 FB Neck ROM: Full    Dental  (+) Teeth Intact   Pulmonary former smoker,          Cardiovascular + Past MI and + Cardiac Stents     Neuro/Psych    GI/Hepatic   Endo/Other  diabetes, Well Controlled, Type 2, Oral Hypoglycemic Agents  Renal/GU      Musculoskeletal  (+) Arthritis -,   Abdominal   Peds  Hematology   Anesthesia Other Findings   Reproductive/Obstetrics                          Anesthesia Physical Anesthesia Plan  ASA: III  Anesthesia Plan: General   Post-op Pain Management:    Induction: Intravenous  Airway Management Planned: Oral ETT  Additional Equipment:   Intra-op Plan:   Post-operative Plan: Extubation in OR  Informed Consent: I have reviewed the patients History and Physical, chart, labs and discussed the procedure including the risks, benefits and alternatives for the proposed anesthesia with the patient or authorized representative who has indicated his/her understanding and acceptance.   Dental advisory given  Plan Discussed with: CRNA and Surgeon  Anesthesia Plan Comments:        Anesthesia Quick Evaluation

## 2014-02-20 NOTE — Discharge Instructions (Signed)
° °  Metta Clines. Supple, M.D., F.A.A.O.S. Orthopaedic Surgery Specializing in Arthroscopic and Reconstructive Surgery of the Shoulder and Knee [phone] [address] [place], [place] [address] - Fax [phone]   POST-OP TOTAL SHOULDER REPLACEMENT INSTRUCTIONS  1. Call the office at [phone] to schedule your first post-op appointment 10-14 days from the date of your surgery.  2. The bandage over your incision is waterproof. You may begin showering with this dressing on. You may leave this dressing on until first follow up appointment within 2 weeks. If you would like to remove it you may do so after the 5th day. Go slow and tug at the borders gently to break the bond the dressing has with the skin. The steri strips may come off with the dressing. At this point if there is no drainage it is okay to go without a bandage or you may cover it with a light guaze and tape. Leave the steri-strips in place over your incision. You can expect drainage that is bloody or yellow in nature that should gradually decrease from day of surgery. Change your dressing daily until drainage is completely resolved, then you may feel free to go without a bandage. You can also expect significant bruising around your shoulder that will drift down your arm and into your chest wall. This is very normal and should resolve over several days.   3. Wear your sling/immobilizer at all times except to perform the exercises below or to occasionally let your arm dangle by your side to stretch your elbow. You also need to sleep in your sling immobilizer until instructed otherwise.  4. Range of motion to your elbow, wrist, and hand are encouraged 3-5 times daily. Exercise to your hand and fingers helps to reduce swelling you may experience.  5. Utilize ice to the shoulder 3-5 times minimum a day and additionally if you are experiencing pain.  6. Prescriptions for a pain medication and a muscle relaxant are provided for  you. It is recommended that if you are experiencing pain that you pain medication alone is not controlling, add the muscle relaxant along with the pain medication which can give additional pain relief. The first 1-2 days is generally the most severe of your pain and then should gradually decrease. As your pain lessens it is recommended that you decrease your use of the pain medications to an "as needed basis'" only and to always comply with the recommended dosages of the pain medications.  7. Pain medications can produce constipation along with their use. If you experience this, the use of an over the counter stool softener or laxative daily is recommended.   8. For most patients, if insurance allows, home health services to include therapy has been arranged.  9. For additional questions or concerns, please do not hesitate to call the office. If after hours there is an answering service to forward your concerns to the physician on call.  POST-OP EXERCISES  Pendulum Exercises  Perform pendulum exercises while standing and bending at the waist. Support your uninvolved arm on a table or chair and allow your operated arm to hang freely. Make sure to do these exercises passively - not using you shoulder muscles.  Repeat 20 times. Do 3 sessions per day.

## 2014-02-20 NOTE — Transfer of Care (Signed)
Immediate Anesthesia Transfer of Care Note  Patient: Blake Patterson  Procedure(s) Performed: Procedure(s): RIGHT TOTAL SHOULDER ARTHROPLASTY (Right)  Patient Location: PACU  Anesthesia Type:General  Level of Consciousness: awake, alert  and oriented  Airway & Oxygen Therapy: Patient Spontanous Breathing and Patient connected to nasal cannula oxygen  Post-op Assessment: Report given to PACU RN, Post -op Vital signs reviewed and stable and Patient moving all extremities X 4  Post vital signs: Reviewed and stable  Complications: No apparent anesthesia complications

## 2014-02-20 NOTE — H&P (Signed)
Blake Patterson    Chief Complaint: OA OF RIGHT SHOULDER HPI: The patient is a 80 y.o. male with end stage right shoulder OA  Past Medical History  Diagnosis Date  . CAD (coronary artery disease)   . Ischemic cardiomyopathy   . Dyslipidemia   . Cardiac conduction disorder   . Left ventricular apical thrombus   . MI (myocardial infarction)   . Presence of stent in artery   . Diabetes mellitus without complication   . Arthritis   . Cancer     skin    Past Surgical History  Procedure Laterality Date  . Cardiac catheterization  12/04/2008    Proximal and Distal LAD, stented w/ a non-drug-eluting 3.5x40mm ZETA stent at 8atm, distally w/ a 3x62mm VISION stent, resulting in reduction of 100% occlusion to less than 30%.  . Cardiovascular stress test  01/13/2009    Significant perfusion seen in Apical, Basal Anterior, Basal Anteroseptal, and Apical Anterior regions-consistent w/ infarct/scar. EKG negative for ischemia. No ECG changes. No significant ischemia demonstrated.  . Transthoracic echocardiogram  03/25/2009    EF 35-45%, mild-moderate global hypokinesis  . Joint replacement      scopes bil knees    History reviewed. No pertinent family history.  Social History:  reports that he quit smoking about 60 years ago. He has never used smokeless tobacco. He reports that he does not drink alcohol or use illicit drugs.  Allergies:  Allergies  Allergen Reactions  . Oxycodone     "makes him crazy"     Medications Prior to Admission  Medication Sig Dispense Refill  . aspirin EC 81 MG tablet Take 81 mg by mouth daily.      Marland Kitchen CINNAMON PO Take 1,000 mg by mouth daily.      . Coenzyme Q10 200 MG capsule Take 200 mg by mouth daily.      . famotidine (PEPCID) 40 MG tablet Take 40 mg by mouth daily.      . Garlic 2620 MG CAPS Take 1 capsule by mouth daily.      . isosorbide mononitrate (IMDUR) 30 MG 24 hr tablet Take 1 tablet (30 mg total) by mouth daily.  30 tablet  5  . LIVALO 2 MG TABS  Take 1 tablet by mouth every Monday, Wednesday, and Friday.       . losartan (COZAAR) 25 MG tablet Take 1 tablet (25 mg total) by mouth daily.  30 tablet  5  . metFORMIN (GLUCOPHAGE) 500 MG tablet Take 250 mg by mouth daily with breakfast.      . Multiple Vitamins-Minerals (CENTRUM SILVER ULTRA MENS PO) Take 1 tablet by mouth daily.      . Omega-3 Fatty Acids (FISH OIL) 1000 MG CAPS Take 1 capsule by mouth daily.      . traMADol (ULTRAM) 50 MG tablet Take 50 mg by mouth every 6 (six) hours as needed.         Physical Exam: right shoulder with painful and restricted motion as noted at recent office visits  Vitals  Temp:  [98 F (36.7 C)] 98 F (36.7 C) (09/10 0557) Pulse Rate:  [55-70] 70 (09/10 0727) Resp:  [20] 20 (09/10 0557) BP: (150)/(64) 150/64 mmHg (09/10 0557) SpO2:  [95 %] 95 % (09/10 0557) Weight:  [83.008 kg (183 lb)] 83.008 kg (183 lb) (09/10 0557)  Assessment/Plan  Impression: OA OF RIGHT SHOULDER  Plan of Action: Procedure(s): RIGHT TOTAL SHOULDER ARTHROPLASTY  Tyge Somers M 02/20/2014, 7:28 AM

## 2014-02-20 NOTE — Anesthesia Procedure Notes (Addendum)
Procedure Name: Intubation Date/Time: 02/20/2014 7:42 AM Performed by: Rush Farmer E Pre-anesthesia Checklist: Patient identified, Emergency Drugs available, Suction available, Patient being monitored and Timeout performed Patient Re-evaluated:Patient Re-evaluated prior to inductionOxygen Delivery Method: Circle system utilized Preoxygenation: Pre-oxygenation with 100% oxygen Intubation Type: IV induction Ventilation: Mask ventilation without difficulty Laryngoscope Size: Mac and 3 Grade View: Grade I Tube type: Oral Tube size: 7.5 mm Number of attempts: 1 Airway Equipment and Method: Stylet Placement Confirmation: ETT inserted through vocal cords under direct vision,  positive ETCO2 and breath sounds checked- equal and bilateral Secured at: 22 cm Tube secured with: Tape Dental Injury: Teeth and Oropharynx as per pre-operative assessment     Anesthesia Regional Block:  Interscalene brachial plexus block  Pre-Anesthetic Checklist: ,, timeout performed, Correct Patient, Correct Site, Correct Laterality, Correct Procedure, Correct Position, site marked, Risks and benefits discussed,  Surgical consent,  Pre-op evaluation,  At surgeon's request and post-op pain management  Laterality: Right  Prep: chloraprep       Needles:  Injection technique: Single-shot  Needle Type: Echogenic Stimulator Needle     Needle Length: 9cm 9 cm Needle Gauge: 21 and 21 G    Additional Needles:  Procedures: ultrasound guided (picture in chart) and nerve stimulator Interscalene brachial plexus block  Nerve Stimulator or Paresthesia:  Response: 0.4 mA,   Additional Responses:   Narrative:  Start time: 02/20/2014 7:10 AM End time: 02/20/2014 7:25 AM Injection made incrementally with aspirations every 5 mL. Anesthesiologist: Lillia Abed MD  Additional Notes: Monitors applied. Patient sedated. Sterile prep and drape,hand hygiene and sterile gloves were used. Relevant anatomy identified.Needle  position confirmed.Local anesthetic injected incrementally after negative aspiration. Local anesthetic spread visualized around nerve(s). Vascular puncture avoided. No complications. Image printed for medical record.The patient tolerated the procedure well.

## 2014-02-20 NOTE — Op Note (Signed)
NAME:  Blake Patterson, PENNISI NO.:  [id number]  MEDICAL RECORD NO.:  [id number]  LOCATION:  [facility]                         FACILITY:  [facility]  PHYSICIAN:  Metta Clines. Earnestine Tuohey, M.D.  DATE OF BIRTH:  06/15/1926  DATE OF PROCEDURE:  02/20/2014 DATE OF DISCHARGE:                              OPERATIVE REPORT   PREOPERATIVE DIAGNOSIS:  End-stage right shoulder osteoarthritis.  POSTOPERATIVE DIAGNOSIS:  End-stage right shoulder osteoarthritis.  PROCEDURE:  Right total shoulder arthroplasty utilizing a press-fit size 16 DePuy hybrid stem with a 52 x 18 eccentric head and a cemented pegged 48 glenoid.  SURGEON:  Metta Clines. Talaysha Freeberg, M.D.  Terrence DupontOlivia Mackie A. Shuford, PA-C  ANESTHESIA:  General endotracheal as well as an interscalene block.  ESTIMATED BLOOD LOSS:  250 mL.  DRAINS:  None.  HISTORY:  Blake Patterson is an 80 year old gentleman who has had chronic and progressively increasing right shoulder pain related to end-stage osteoarthritis.  His plain radiographs confirmed marked bony deformity of the humeral head and glenoid, clear obliteration of the joint space, periarticular osteophyte formation, subchondral sclerosis.  Due to his increasing pain, functional limitations, and failure to respond to prolonged attempts at conservative management, he is brought to the operating room at this time for planned right total shoulder arthroplasty.  Preoperatively, I counseled Blake Patterson regarding treatment options as well as risks versus benefits thereof.  Possible surgical complications were all reviewed including potential for bleeding, infection, neurovascular injury, persistent pain, loss of motion, failure of the implant, anesthetic complications, and possible need for additional surgery.  He understands and accepts and agrees with our planned procedure.  PROCEDURE IN DETAIL:  After undergoing routine preop evaluation, the patient received prophylactic antibiotics and an  interscalene block was established in the holding area by the Anesthesia Department.  He was placed supine on the operating table, underwent smooth induction of a general endotracheal anesthesia.  Placed in beach-chair position and appropriately padded and protected.  The right shoulder girdle region was then sterilely prepped and draped in standard fashion.  Time-out was called.  An anterior approach to the right shoulder was made through a 10 cm deltopectoral incision.  Skin flaps were elevated and electrocautery was used for hemostasis.  The deltopectoral interval was then identified and developed from proximal to distal with the cephalic vein retracted laterally.  Upper 1.5 cm of pec major was tenotomized to improve exposure.  The conjoined tendon was identified, mobilized, and retracted medially and adhesions and deltoid were then divided. Retractors were placed and at this point, the biceps tendon was unroofed, it was tenotomized for later tenodesis, and then we split the rotator cuff along the rotator interval from the apex of the bicipital groove to the base of the coracoid and then divided the subscap from the lesser tuberosity leaving 1 cm cuff of tissue for later repair and tagged the free margin with a series of #2 FiberWire grasping sutures. We then divided the capsule tissues from the anterior and inferior aspects of the humeral head allowing delivery of the head through the wound and very large osteophytes were noted anteriorly and inferiorly. We used the extramedullary guide to outline the proposed  cut on the humeral head and this was then performed using oscillating saw at the native retroversion, which was approximately 30 degrees.  Care was taken to protect the rotator cuff superiorly and posteriorly.  At this point, we then used an osteotome, we removed large osteophytes from the anterior and inferior aspects of the humeral head.  We then reamed the humeral canal up to  a size 16.  We then broached size 16 and placed a trial stem, which showed good fit.  At this point, the trials were removed.  We placed a metal cap over the cut proximal humeral surface and then used a combination of pitchfork, snake tongue, and Fukuda retractors to expose the glenoid circumferentially.  We removed the retained proximal stump of the biceps tendon as well as the superior labral tissues and performed a circumferential labral resection.  Gained complete exposure of the periphery of the glenoid.  A central guide pin was then placed onto the glenoid, it was then reamed to a stable subchondral bony bed and this was most appropriate with 48 mm reamer. We then placed a central drill hole followed by the peripheral peg holes and the 48 trial showed excellent fit and fixation.  We removed residual soft tissue and bony debris.  At this point, cement was mixed in the appropriate consistency, we introduced cement into the peripheral peg holes and the glenoid was then impacted into position maintaining stable surface.  After the cement had hardened, we then returned our attention to the proximal humerus where we used bone graft harvested from the resected humeral head to graft around our final 16 stem and 16 stem was then impacted with excellent fit and fixation.  We then performed trial reductions and the 52 x 18 eccentric head showed the best coverage of the proximal humerus and good soft tissue balance with 50% translation of the humeral head and glenoid.  At this point, the final 52 x 18 eccentric head was impacted onto the cleaned Morse taper and final reduction was performed showing excellent fit and fixation.  Final reduction was then performed.  Irrigation was completed.  Hemostasis was obtained.  The subscapularis was then repaired back to the lesser tuberosity using the #2 Fiber wires.  We had an additional FiberWire at the most inferior aspect of the subscap to reinforce the  repair and then also closed the rotator interval with a series of 3 figure-of-eight #2 FiberWire sutures allowing excellent soft tissue reapproximation and maintaining easily 30 degrees of external rotation with the arm at the side.  We then performed a biceps tendon tenodesis the margin of the pec major.  The wound was then terminally irrigated, hemostasis was obtained.  The deltopectoral interval was then reapproximated with a series of figure-of-eight #1 Vicryl sutures, 2-0 Vicryl for the subcu layer, and intracuticular 3-0 Monocryl for the skin followed by Dermabond, dry dressing.  The right arm was placed in a sling.  The patient was then awakened, extubated, and taken to the recovery room in stable condition.  Tracy A. Shuford, PA-C was used as an Environmental consultant throughout this case, potential for help with positioning the patient, positioning of the extremity, tissue manipulation, retraction, implantation of the prosthesis, wound closure, and intraoperative decision making.     Metta Clines. Cledith Abdou, M.D.     KMS/MEDQ  D:  02/20/2014  T:  02/20/2014  Job:  [id number]

## 2014-02-20 NOTE — Anesthesia Postprocedure Evaluation (Signed)
Anesthesia Post Note  Patient: Blake Patterson  Procedure(s) Performed: Procedure(s) (LRB): RIGHT TOTAL SHOULDER ARTHROPLASTY (Right)  Anesthesia type: general  Patient location: PACU  Post pain: Pain level controlled  Post assessment: Patient's Cardiovascular Status Stable  Last Vitals:  Filed Vitals:   02/20/14 1033  BP: 118/53  Pulse: 77  Temp: 36.7 C  Resp: 18    Post vital signs: Reviewed and stable  Level of consciousness: sedated  Complications: No apparent anesthesia complications

## 2014-02-21 LAB — CBC
HCT: 39.4 % (ref 39.0–52.0)
Hemoglobin: 12.8 g/dL — ABNORMAL LOW (ref 13.0–17.0)
MCH: 29.8 pg (ref 26.0–34.0)
MCHC: 32.5 g/dL (ref 30.0–36.0)
MCV: 91.6 fL (ref 78.0–100.0)
PLATELETS: 90 10*3/uL — AB (ref 150–400)
RBC: 4.3 MIL/uL (ref 4.22–5.81)
RDW: 14.1 % (ref 11.5–15.5)
WBC: 5.6 10*3/uL (ref 4.0–10.5)

## 2014-02-21 LAB — BASIC METABOLIC PANEL
ANION GAP: 10 (ref 5–15)
BUN: 24 mg/dL — ABNORMAL HIGH (ref 6–23)
CHLORIDE: 103 meq/L (ref 96–112)
CO2: 25 mEq/L (ref 19–32)
CREATININE: 0.98 mg/dL (ref 0.50–1.35)
Calcium: 8.6 mg/dL (ref 8.4–10.5)
GFR calc Af Amer: 84 mL/min — ABNORMAL LOW (ref >90)
GFR calc non Af Amer: 73 mL/min — ABNORMAL LOW (ref >90)
Glucose, Bld: 140 mg/dL — ABNORMAL HIGH (ref 70–99)
Potassium: 4.5 mEq/L (ref 3.7–5.3)
Sodium: 138 mEq/L (ref 137–147)

## 2014-02-21 LAB — GLUCOSE, CAPILLARY
Glucose-Capillary: 130 mg/dL — ABNORMAL HIGH (ref 70–99)
Glucose-Capillary: 121 mg/dL — ABNORMAL HIGH (ref 70–99)
Glucose-Capillary: 114 mg/dL — ABNORMAL HIGH (ref 70–99)

## 2014-02-21 MED ORDER — SODIUM CHLORIDE 0.9 % IV BOLUS (SEPSIS)
500.0000 mL | Freq: Once | INTRAVENOUS | Status: AC
  Administered 2014-02-21: 500 mL via INTRAVENOUS

## 2014-02-21 NOTE — Progress Notes (Signed)
Blake Patterson  MRN: [id number] DOB/Age: 78-Jan-1928 78 y.o. Physician: Blake Patterson, M.D. 1 Day Post-Op Procedure(s) (LRB): RIGHT TOTAL SHOULDER ARTHROPLASTY (Right)  Subjective: Sitting at bedside eating breakfast, reports good appetite and minimal pain. States he had a "rough night" but feeling much better this am. Episode of hypotension last pm which responded to fluid bolus. Denies CP, SOB Vital Signs Temp:  [97.2 F (36.2 C)-99.4 F (37.4 C)] 98 F (36.7 C) (09/11 0500) Pulse Rate:  [50-106] 83 (09/11 0500) Resp:  [14-20] 16 (09/11 0500) BP: (69-147)/(35-85) 115/55 mmHg (09/11 0500) SpO2:  [93 %-98 %] 93 % (09/11 0500)  Lab Results  Recent Labs  02/18/14 1419 02/21/14 0620  WBC 5.5 5.6  HGB 13.9 12.8*  HCT 43.7 39.4  PLT 147* PENDING   BMET  Recent Labs  02/18/14 1419 02/21/14 0620  NA 144 138  K 4.3 4.5  CL 108 103  CO2 22 25  GLUCOSE 115* 140*  BUN 30* 24*  CREATININE 1.19 0.98  CALCIUM 9.3 8.6   INR  Date Value Ref Range Status  02/18/2014 1.06  0.00 - 1.49 Final     Exam  Right shoulder dressing intact, scant dried blood distally, moderate swelling about elbow as expected, n/v intact distally  Plan Episode last night likely vasovagal. Stable and comfortable this am. D/c home today after Visit w OT/PT for standard TSA protocol. F/u 7 to 10 days Blake Patterson M 02/21/2014, 7:41 AM

## 2014-02-21 NOTE — Care Management Note (Signed)
CARE MANAGEMENT NOTE 02/21/2014  Patient:  CARROL, BONDAR   Account Number:  [id number]  Date Initiated:  02/21/2014  Documentation initiated by:  Ricki Miller  Subjective/Objective Assessment:   80 yr old male admitted with osteoarthritis of the right shoulder, s/p right total shoulder arthroplasty.     Action/Plan:   Case manager spoke with patient and family concerning home health and DME needs at discharge. Choice offered, referral called to Rhett Bannister, [facility] Liaison. Patient has family support at discharge. [place] ordered.   Anticipated DC Date:  02/21/2014   Anticipated DC Plan:  [place]  CM consult      PAC Choice  [place]   Choice offered to / List presented to:  C-3 Spouse   DME arranged  [facility]      DME agency  [place] arranged  [place]   Status of service:  Completed, signed off Medicare Important Message given?  NA - LOS <3 / Initial given by admissions (If response is "NO", the following Medicare IM given date fields will be blank) Date Medicare IM given:   Medicare IM given by:   Date Additional Medicare IM given:   Additional Medicare IM given by:    Discharge Disposition:  [place]  Per UR Regulation:  Reviewed for med. necessity/level of care/duration of stay

## 2014-02-21 NOTE — Progress Notes (Signed)
Blake Patterson to be D/C'd Home per MD order. Discussed with the patient and all questions fully answered.    Medication List         aspirin EC 81 MG tablet  Take 81 mg by mouth daily.     CENTRUM SILVER ULTRA MENS PO  Take 1 tablet by mouth daily.     CINNAMON PO  Take 1,000 mg by mouth daily.     Coenzyme Q10 200 MG capsule  Take 200 mg by mouth daily.     diazepam 5 MG tablet  Commonly known as:  VALIUM  Take 0.5-1 tablets (2.5-5 mg total) by mouth every 6 (six) hours as needed for muscle spasms or sedation.     famotidine 40 MG tablet  Commonly known as:  PEPCID  Take 40 mg by mouth daily.     Fish Oil 1000 MG Caps  Take 1 capsule by mouth daily.     Garlic 5465 MG Caps  Take 1 capsule by mouth daily.     HYDROmorphone 2 MG tablet  Commonly known as:  DILAUDID  Take 1 tablet (2 mg total) by mouth every 4 (four) hours as needed for severe pain.     isosorbide mononitrate 30 MG 24 hr tablet  Commonly known as:  IMDUR  Take 1 tablet (30 mg total) by mouth daily.     LIVALO 2 MG Tabs  Generic drug:  Pitavastatin Calcium  Take 1 tablet by mouth every Monday, Wednesday, and Friday.     losartan 25 MG tablet  Commonly known as:  COZAAR  Take 1 tablet (25 mg total) by mouth daily.     metFORMIN 500 MG tablet  Commonly known as:  GLUCOPHAGE  Take 250 mg by mouth daily with breakfast.     traMADol 50 MG tablet  Commonly known as:  ULTRAM  Take 1 tablet (50 mg total) by mouth every 6 (six) hours as needed.        VVS, Skin clean, dry and intact without evidence of skin break down, no evidence of skin tears noted.  IV catheter discontinued intact. Site without signs and symptoms of complications. Dressing and pressure applied.  An After Visit Summary was printed and given to the patient.  Patient escorted via [place], and D/C home via private auto.  [name]  02/21/2014 1:25 PM

## 2014-02-21 NOTE — Progress Notes (Signed)
Pt requested to be repositioned in chair.  When pt sat up in chair he slumped over and went unresponsive.  Pt was warm and diaphoretic, with a palpable pulse. Pt laid back in chair and VS obtained.  SBP-60s, HR-50.  Rapid response called and 500cc NS bolus started.  Pt sats 93% on RA.  Pt placed on 2L [place]. 12 lead EKG obtained showing SR c 1st degree heartblock and septal infarct (age undetermined).  Pt regained consciousness after less than a minute and is now alert and oriented.  SBP now in 90s, HR in 60s.  Also noted pts R arm swollen, tight, and bruised. PA [facility] notified of this and above events.  Order for continuous telemetry monitoring.  Will monitor pt.

## 2014-02-21 NOTE — Evaluation (Signed)
Occupational Therapy Evaluation Patient Details Name: Blake Patterson MRN: [id number] DOB: 12/01/1926 Today's Date: 02/21/2014    History of Present Illness 80 yr old male admitted with osteoarthritis of the right shoulder, s/p right total shoulder arthroplasty.     Clinical Impression   Pt s/p right TSA.  Will benefit from acute OT services to address below problem list.  Recommend 24/7 supervision/assist at home due to limited R UE use and h/o falls.  Pt's wife and son will be able to provide necessary assist.  Pt anticipates d/c home later today, but OT will continue to treat if pt remains in hospital over weekend.  Recommend HHOT for d/c plan.    Follow Up Recommendations  Home health OT;Supervision/Assistance - 24 hour    Equipment Recommendations  None recommended by OT    Recommendations for Other Services       Precautions / Restrictions Precautions Precautions: Fall;Shoulder Precaution Comments: pt reports 2 falls at home  Required Braces or Orthoses: Sling Restrictions Weight Bearing Restrictions: No      Mobility Bed Mobility               General bed mobility comments: not addressed; pt up in chair and returned to chair   Transfers Overall transfer level: Needs assistance Equipment used: 1 person hand held assist Transfers: Sit to/from Stand Sit to Stand: Min guard         General transfer comment: cues for hand placement and safety; hand held (A) to steady    Balance Overall balance assessment: History of Falls;Needs assistance         Standing balance support: During functional activity;Single extremity supported Standing balance-Leahy Scale: Poor Standing balance comment: pt required UE support to balance              High level balance activites: Direction changes;Head turns High Level Balance Comments: pt easily distracted and becomes off balance with head turns and directional changes             ADL Overall ADL's : Needs  assistance/impaired                 Upper Body Dressing : Minimal assistance;Sitting   Lower Body Dressing: Minimal assistance;Sitting/lateral leans   Toilet Transfer: Min guard;Ambulation           Functional mobility during ADLs: Min guard General ADL Comments: OT educated pt and caregivers on ADL techniques and LUE HEP.  Wife and son able to provide assist with ADLs as needed.  OT instructed on donning/doffing of shirt and technique.  Pt able to don/doff sling with min assist.  Perfomed LUE HEP with therapist, max cues for technique.      Vision                     Perception     Praxis      Pertinent Vitals/Pain Pain Assessment: 0-10 Pain Score: 5  Pain Location: right shoulder Pain Descriptors / Indicators: Aching Pain Intervention(s): Repositioned;Ice applied     Hand Dominance Right   Extremity/Trunk Assessment Upper Extremity Assessment Upper Extremity Assessment: LUE deficits/detail LUE Deficits / Details: decreased shoulder PROM due to pain.  Hand, wrist and elbow AROM WFL.   Lower Extremity Assessment Lower Extremity Assessment: Generalized weakness   Cervical / Trunk Assessment Cervical / Trunk Assessment: Normal   Communication Communication Communication: No difficulties   Cognition Arousal/Alertness: Awake/alert Behavior During Therapy: WFL for tasks assessed/performed Overall Cognitive Status: Within Functional  Limits for tasks assessed                     General Comments       Exercises Exercises: Shoulder     Shoulder Instructions Shoulder Instructions Donning/doffing shirt without moving shoulder: Minimal assistance Method for sponge bathing under operated UE: Minimal assistance Donning/doffing sling/immobilizer: Minimal assistance Correct positioning of sling/immobilizer: Minimal assistance Pendulum exercises (written home exercise program): Min-guard ROM for elbow, wrist and digits of operated UE:  [place] expects to be discharged to:: Private residence Living Arrangements: Spouse/significant other;Children Available Help at Discharge: Family;Available 24 hours/day Type of Home: House Home Access: Ramped entrance     Home Layout: One level     Bathroom Shower/Tub: Occupational psychologist: Handicapped height     Home Equipment: [place] - single point;Shower seat;Grab bars - tub/shower   Additional Comments: pt has elevated toilet seat      Prior Functioning/Environment Level of Independence: Independent             OT Diagnosis: Generalized weakness;Acute pain   OT Problem List: Decreased activity tolerance;Decreased strength;Impaired balance (sitting and/or standing);Pain;Impaired UE functional use;Decreased knowledge of precautions   OT Treatment/Interventions: Self-care/ADL training;DME and/or AE instruction;Therapeutic activities;Patient/family education;Balance training;Therapeutic exercise    OT Goals(Current goals can be found in the care plan section) Acute Rehab OT Goals Patient Stated Goal: to go home today OT Goal Formulation: With patient/family Time For Goal Achievement: 02/28/14 Potential to Achieve Goals: Good  OT Frequency: Min 2X/week   Barriers to D/C:            Co-evaluation              End of Session Equipment Utilized During Treatment:  (sling)  Activity Tolerance: Patient tolerated treatment well Patient left: in chair;with call bell/phone within reach;with family/visitor present   Time: 8841-6606 OT Time Calculation (min): 43 min Charges:  OT General Charges $OT Visit: 1 Procedure OT Evaluation $Initial OT Evaluation Tier I: 1 Procedure OT Treatments $Self Care/Home Management : 8-22 mins $Therapeutic Activity: 8-22 mins $Therapeutic Exercise: 8-22 mins G-Codes:    Darrol Jump 02/21/2014, 11:56 AM  02/21/2014 Darrol Jump OTR/L Pager  [phone] Office [phone]

## 2014-02-21 NOTE — Progress Notes (Signed)
Called by Benjamine Mola , RN stating pt hypotensive with BP 69/35 and HR 50.  CBG 130  S/P Right shoulder surgery on 02/20/14.   On arrival to floor pt sitting in recliner alert, oriented , c/o right shoulder pain 3/10.  Denies CP, SOB.  Pt's RN Leslye Peer stated pt requested a pillow under him in the recliner.  As pt leaned forward he had a  syncopal episode  Lasting approximately 20 seconds.  Palpable pulse present during event.  Pt states he remembers only feeling very hot prior to event.  Speech clear, pupils PERL, RUE  In sling with ecchymosis and  swelling noted, warm to touch anteriory , posterior RUE cold to touch and hard.  Palpable Right radial pulse, CNS intact with no numbness or  Tingling.   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  VS after 250 cc bolus :  96/46 HR 60 .  Jerilynn Mages, PA notified of events by patient's RN [place].  Pt to be place on continuous tele and continuous pulse ox.  0220 :  106/49  68 .  Hand off report given to [facility], RN   Will continue to follow as needed.

## 2014-02-21 NOTE — Evaluation (Signed)
Physical Therapy Evaluation Patient Details Name: Blake Patterson MRN: [id number] DOB: [birth year] Today's Date: 02/21/2014   History of Present Illness  80 yr old male admitted with osteoarthritis of the right shoulder, s/p right total shoulder arthroplasty.    Clinical Impression  Patient is s/p above surgery resulting in functional limitations due to the deficits listed below (see PT Problem List). Patient will benefit from skilled PT to increase their independence and safety with mobility to allow discharge to the venue listed below. Pt unsteady with gt and reports multiple falls at home prior to surgery. Recommend use of quad cane and 24/7 (A) for mobility upon D/C home at all times. Family reports the pt will have 24/7 (A). Recommend HHPT for continued therapy to ensure safe transition home.      Follow Up Recommendations Home health PT;Supervision/Assistance - 24 hour    Equipment Recommendations  Other (comment) (Quad cane)    Recommendations for Other Services OT consult     Precautions / Restrictions Precautions Precautions: Fall;Shoulder Precaution Comments: pt reports 2 falls at home  Required Braces or Orthoses: Sling Restrictions Weight Bearing Restrictions: No      Mobility  Bed Mobility               General bed mobility comments: not addressed; pt up in chair and returned to chair   Transfers Overall transfer level: Needs assistance Equipment used: 1 person hand held assist Transfers: Sit to/from Stand Sit to Stand: Min guard         General transfer comment: cues for hand placement and safety; hand held (A) to steady  Ambulation/Gait Ambulation/Gait assistance: Min assist;Min guard Ambulation Distance (Feet): 150 Feet Assistive device: 1 person hand held assist Gait Pattern/deviations: Scissoring;Decreased stride length;Narrow base of support Gait velocity: decreased  Gait velocity interpretation: Below normal speed for age/gender General Gait  Details: pt required handheld (A) or [place] eof IV pole to stabilize with Lt UE; cues for widen BOS; pt has tendency to scissor ambulate and IR bil LEs; pt is a fall risk and recommend to pt and family pt have (A) for mobility at home at all times   Stairs            Wheelchair Mobility    Modified Rankin (Stroke Patients Only)       Balance Overall balance assessment: History of Falls;Needs assistance         Standing balance support: During functional activity;Single extremity supported Standing balance-Leahy Scale: Poor Standing balance comment: pt required UE support to balance              High level balance activites: Direction changes;Head turns High Level Balance Comments: pt easily distracted and becomes off balance with head turns and directional changes              Pertinent Vitals/Pain Pain Assessment: 0-10 Pain Score: 4  Pain Location: Rt shoulder  Pain Descriptors / Indicators: Aching Pain Intervention(s): Limited activity within patient's tolerance;Repositioned;Ice applied;Premedicated before session    [place] expects to be discharged to:: Private residence Living Arrangements: Spouse/significant other;Children Available Help at Discharge: Family;Available 24 hours/day Type of Home: House Home Access: Ramped entrance     Home Layout: One level Home Equipment: Cane - single point;Shower seat;Grab bars - tub/shower Additional Comments: pt has elevated toilet seat    Prior Function Level of Independence: Independent               Hand Dominance   Dominant  Hand: Right    Extremity/Trunk Assessment   Upper Extremity Assessment: Defer to OT evaluation           Lower Extremity Assessment: Generalized weakness      Cervical / Trunk Assessment: Normal  Communication   Communication: No difficulties  Cognition Arousal/Alertness: Awake/alert Behavior During Therapy: WFL for tasks assessed/performed Overall  Cognitive Status: Within Functional Limits for tasks assessed                      General Comments      Exercises        Assessment/Plan    PT Assessment Patient needs continued PT services  PT Diagnosis Abnormality of gait;Generalized weakness   PT Problem List Decreased strength;Decreased range of motion;Decreased activity tolerance;Decreased balance;Decreased mobility;Decreased knowledge of use of DME;Decreased safety awareness;Pain  PT Treatment Interventions DME instruction;Gait training;Functional mobility training;Therapeutic activities;Therapeutic exercise;Balance training;Neuromuscular re-education;Patient/family education   PT Goals (Current goals can be found in the Care Plan section) Acute Rehab PT Goals Patient Stated Goal: to go home today PT Goal Formulation: With patient Time For Goal Achievement: 02/23/14 Potential to Achieve Goals: Good    Frequency Min 3X/week   Barriers to discharge        Co-evaluation               End of Session Equipment Utilized During Treatment: Gait belt;Other (comment) (Lt UE sling) Activity Tolerance: Patient tolerated treatment well Patient left: in chair;with call bell/phone within reach;with family/visitor present Nurse Communication: Mobility status;Precautions         Time: 1010-1026 PT Time Calculation (min): 16 min   Charges:   PT Evaluation $Initial PT Evaluation Tier I: 1 Procedure PT Treatments $Gait Training: 8-22 mins   PT G CodesGustavus Patterson, [place] 02/21/2014, 11:23 AM

## 2014-02-27 ENCOUNTER — Encounter (HOSPITAL_COMMUNITY): Payer: Self-pay | Admitting: Orthopedic Surgery

## 2014-02-28 ENCOUNTER — Other Ambulatory Visit (HOSPITAL_COMMUNITY): Payer: Self-pay | Admitting: Cardiology

## 2014-02-28 NOTE — Telephone Encounter (Signed)
Rx was sent to pharmacy electronically. 

## 2014-05-01 NOTE — Discharge Summary (Signed)
PATIENT ID:      Blake Patterson  MRN:     [id number] DOB/AGE:    78-Aug-1928 / 78 y.o.     DISCHARGE SUMMARY  ADMISSION DATE:    02/20/2014 DISCHARGE DATE:  02/21/2014  ADMISSION DIAGNOSIS: OA OF RIGHT SHOULDER Past Medical History  Diagnosis Date  . CAD (coronary artery disease)   . Ischemic cardiomyopathy   . Dyslipidemia   . Cardiac conduction disorder   . Left ventricular apical thrombus   . MI (myocardial infarction)   . Presence of stent in artery   . Diabetes mellitus without complication   . Arthritis   . Cancer     skin    DISCHARGE DIAGNOSIS:   Active Problems:   S/P shoulder replacement   PROCEDURE: Procedure(s): RIGHT TOTAL SHOULDER ARTHROPLASTY on 02/20/2014  CONSULTS:     HISTORY:  See H&P in chart.  HOSPITAL COURSE:  Blake Patterson is a 80 y.o. admitted on 02/20/2014 with a chief complaint of right shoulder pain and dysfunction, and found to have a diagnosis of OA OF RIGHT SHOULDER.  They were brought to the operating room on 02/20/2014 and underwent Procedure(s): RIGHT TOTAL SHOULDER ARTHROPLASTY.    They were given perioperative antibiotics:  Anti-infectives    Start     Dose/Rate Route Frequency Ordered Stop   02/20/14 1300  ceFAZolin (ANCEF) IVPB 1 g/50 mL premix     1 g100 mL/hr over 30 Minutes Intravenous Every 6 hours 02/20/14 1047 02/21/14 0100   02/20/14 0600  ceFAZolin (ANCEF) IVPB 2 g/50 mL premix     2 g100 mL/hr over 30 Minutes Intravenous On call to O.R. 02/19/14 1408 02/20/14 0800    .  Patient underwent the above named procedure and tolerated it well. The first night he had an episode of hypotension that responded to a fluid bolus.The following day they were hemodynamically stable and pain was controlled on oral analgesics. They were neurovascularly intact to the operative extremity. OT was ordered and worked with patient per protocol. They were medically and orthopaedically stable for discharge on 02/21/2014.    DIAGNOSTIC STUDIES:  RECENT  RADIOGRAPHIC STUDIES :  No results found.  RECENT VITAL SIGNS:  No data found. Marland Kitchen  RECENT EKG RESULTS:    Orders placed or performed during the hospital encounter of 02/20/14  . EKG 12-Lead  . EKG 12-Lead    DISCHARGE INSTRUCTIONS:    DISCHARGE MEDICATIONS:     Medication List    STOP taking these medications        isosorbide mononitrate 30 MG 24 hr tablet  Commonly known as:  IMDUR     losartan 25 MG tablet  Commonly known as:  COZAAR      TAKE these medications        aspirin EC 81 MG tablet  Take 81 mg by mouth daily.     CENTRUM SILVER ULTRA MENS PO  Take 1 tablet by mouth daily.     CINNAMON PO  Take 1,000 mg by mouth daily.     Coenzyme Q10 200 MG capsule  Take 200 mg by mouth daily.     diazepam 5 MG tablet  Commonly known as:  VALIUM  Take 0.5-1 tablets (2.5-5 mg total) by mouth every 6 (six) hours as needed for muscle spasms or sedation.     famotidine 40 MG tablet  Commonly known as:  PEPCID  Take 40 mg by mouth daily.     Fish Oil 1000 MG Caps  Take 1 capsule by mouth daily.     Garlic 8329 MG Caps  Take 1 capsule by mouth daily.     HYDROmorphone 2 MG tablet  Commonly known as:  DILAUDID  Take 1 tablet (2 mg total) by mouth every 4 (four) hours as needed for severe pain.     LIVALO 2 MG Tabs  Generic drug:  Pitavastatin Calcium  Take 1 tablet by mouth every Monday, Wednesday, and Friday.     metFORMIN 500 MG tablet  Commonly known as:  GLUCOPHAGE  Take 250 mg by mouth daily with breakfast.     traMADol 50 MG tablet  Commonly known as:  ULTRAM  Take 1 tablet (50 mg total) by mouth every 6 (six) hours as needed.        FOLLOW UP VISIT:       Follow-up Information    Follow up with Marin Shutter, MD.   Specialty:  Orthopedic Surgery   Why:  call to be seen in 10-14 days   Contact information:   [address] [place] [address] [phone]       Follow up with [facility]   Specialty:   [place]   Why:  Someone from [facility] will contact you concerning start date and time for physical therapy.Minette Brine information:   [place] [id number] [phone]       DISCHARGE TO: Home  DISPOSITION: Good  DISCHARGE CONDITION:  Festus Barren for Dr. Lennette Bihari Supple 05/01/2014, 10:04 AM

## 2014-05-22 ENCOUNTER — Encounter (HOSPITAL_COMMUNITY): Payer: Self-pay | Admitting: Cardiovascular Disease

## 2014-07-12 ENCOUNTER — Other Ambulatory Visit: Payer: Self-pay | Admitting: Cardiovascular Disease

## 2014-07-14 NOTE — Telephone Encounter (Signed)
Rx refill sent to patient pharmacy   

## 2014-08-16 ENCOUNTER — Other Ambulatory Visit: Payer: Self-pay | Admitting: Cardiovascular Disease

## 2014-08-18 NOTE — Telephone Encounter (Signed)
Rx(s) sent to pharmacy electronically.  

## 2014-08-28 ENCOUNTER — Encounter: Payer: Self-pay | Admitting: Gastroenterology

## 2014-09-03 ENCOUNTER — Other Ambulatory Visit (HOSPITAL_COMMUNITY): Payer: Self-pay | Admitting: Cardiology

## 2014-09-03 DIAGNOSIS — R0989 Other specified symptoms and signs involving the circulatory and respiratory systems: Secondary | ICD-10-CM

## 2014-09-04 ENCOUNTER — Encounter: Payer: Self-pay | Admitting: Gastroenterology

## 2014-09-04 ENCOUNTER — Ambulatory Visit (INDEPENDENT_AMBULATORY_CARE_PROVIDER_SITE_OTHER): Payer: Medicare Other | Admitting: Gastroenterology

## 2014-09-04 VITALS — BP 108/50 | HR 72 | Ht 66.5 in | Wt 200.0 lb

## 2014-09-04 DIAGNOSIS — D509 Iron deficiency anemia, unspecified: Secondary | ICD-10-CM | POA: Diagnosis not present

## 2014-09-04 MED ORDER — MOVIPREP 100 G PO SOLR
1.0000 | Freq: Once | ORAL | Status: DC
Start: 2014-09-04 — End: 2014-10-08

## 2014-09-04 NOTE — Progress Notes (Signed)
09/04/2014 Blake Patterson [id number] May 13, 1927   HISTORY OF PRESENT ILLNESS:  This is an 81 year old male who is new to our practice and has been referred here by his PCP, Dr. Maudie Mercury, for iron deficiency anemia.  Hgb in 11/2013 was 13.8 grams.  Then it was 10.3 grams in 03/2014 and now is 9.9 grams just earlier this month.  MCV is low at 77.2, iron is borderline low at 38 and ferritin is borderline low at 11.  He was just started on iron supplements by his PCP yesterday.  He has never undergone GI evaluation with EGD and colonoscopy in the past.  He admits that about 6 months ago he had one episode of bright red blood in his underwear, but no recurrence since that time.  Admits to some occasional constipation.  No other complaints.    Has history of CAD with stents in 2010.  EF is 40%.  Has HTN, HLD, and borderline DM.   Past Medical History  Diagnosis Date  . CAD (coronary artery disease)   . Ischemic cardiomyopathy   . Dyslipidemia   . Cardiac conduction disorder   . Left ventricular apical thrombus   . MI (myocardial infarction)   . Presence of stent in artery   . Diabetes mellitus without complication   . Arthritis   . Cancer     skin   Past Surgical History  Procedure Laterality Date  . Cardiac catheterization  12/04/2008    Proximal and Distal LAD, stented w/ a non-drug-eluting 3.5x6m ZETA stent at 8atm, distally w/ a 3x125mVISION stent, resulting in reduction of 100% occlusion to less than 30%.  . Cardiovascular stress test  01/13/2009    Significant perfusion seen in Apical, Basal Anterior, Basal Anteroseptal, and Apical Anterior regions-consistent w/ infarct/scar. EKG negative for ischemia. No ECG changes. No significant ischemia demonstrated.  . Transthoracic echocardiogram  03/25/2009    EF 35-45%, mild-moderate global hypokinesis  . Joint replacement      scopes bil knees  . Total shoulder arthroplasty Right 02/20/2014    Procedure: RIGHT TOTAL SHOULDER ARTHROPLASTY;   Surgeon: KeMarin ShutterMD;  [facility]: [facility];  Laterality: Right;  . Left heart catheterization with coronary angiogram N/A 08/27/2013    Procedure: LEFT HEART CATHETERIZATION WITH CORONARY ANGIOGRAM;  Surgeon: ThTroy SineMD;  [facility];  Service: Cardiovascular;  Laterality: N/A;    reports that he quit smoking about 61 years ago. He has never used smokeless tobacco. He reports that he does not drink alcohol or use illicit drugs. family history includes Prostate cancer in his father. Allergies  Allergen Reactions  . Oxycodone     "makes him crazy"       Outpatient Encounter Prescriptions as of 09/04/2014  Medication Sig  . aspirin EC 81 MG tablet Take 81 mg by mouth daily.  . Marland KitchenINNAMON PO Take 1,000 mg by mouth daily.  . Coenzyme Q10 200 MG capsule Take 200 mg by mouth daily.  . famotidine (PEPCID) 40 MG tablet Take 1 tablet (40 mg total) by mouth daily. NEED APPOINTMENT FOR FUTURE REFILLS  . Garlic 102836G CAPS Take 1 capsule by mouth daily.  . Iron TABS Take 1 tablet by mouth daily.  . isosorbide mononitrate (IMDUR) 30 MG 24 hr tablet TAKE 1 TABLET (30 MG TOTAL) BY MOUTH DAILY.  . Marland KitchenIVALO 2 MG TABS Take 1 tablet by mouth every Monday, Wednesday, and Friday.   .Marland Kitchen  losartan (COZAAR) 25 MG tablet TAKE 1 TABLET (25 MG TOTAL) BY MOUTH DAILY.  . metFORMIN (GLUCOPHAGE) 500 MG tablet Take 250 mg by mouth daily with breakfast.  . Multiple Vitamins-Minerals (CENTRUM SILVER ULTRA MENS PO) Take 1 tablet by mouth daily.  . Omega-3 Fatty Acids (FISH OIL) 1000 MG CAPS Take 1 capsule by mouth daily.  . MOVIPREP 100 G SOLR Take 1 kit (200 g total) by mouth once.  . [DISCONTINUED] diazepam (VALIUM) 5 MG tablet Take 0.5-1 tablets (2.5-5 mg total) by mouth every 6 (six) hours as needed for muscle spasms or sedation.  . [DISCONTINUED] HYDROmorphone (DILAUDID) 2 MG tablet Take 1 tablet (2 mg total) by mouth every 4 (four) hours as needed for severe pain.  .  [DISCONTINUED] traMADol (ULTRAM) 50 MG tablet Take 1 tablet (50 mg total) by mouth every 6 (six) hours as needed.     REVIEW OF SYSTEMS  : All other systems reviewed and negative except where noted in the History of Present Illness.   PHYSICAL EXAM: BP 108/50 mmHg  Pulse 72  Ht 5' 6.5" (1.689 m)  Wt 200 lb (90.719 kg)  BMI 31.80 kg/m2 General: Well developed white male in no acute distress Head: Normocephalic and atraumatic Eyes:  Sclerae anicteric, conjunctiva pink. Ears: Normal auditory acuity Lungs: Clear throughout to auscultation Heart: Regular rate and rhythm Abdomen: Soft, non-distended.  Normal bowel sounds.  Non-tender. Musculoskeletal: Symmetrical with no gross deformities  Skin: No lesions on visible extremities Extremities: No edema  Neurological: Alert oriented x 4, grossly non-focal Psychological:  Alert and cooperative. Normal mood and affect  ASSESSMENT AND PLAN: -Iron deficiency anemia:  Decrease in Hgb from 11/2013 to 03/2014, but now stable.  Iron levels low as well as MCV, but no hemoccult testing performed.  Patient has never undergone GI evaluation with EGD/colonoscopy in the past.  Discussed with Dr. Brodie who recommended EGD and colonoscopy.  Also discussed extensively with the patient and his wife regarding these studies and alternatives including CT scan (or CT colonography) and monitoring/observation while on iron therapy.  Although he is of advanced age, I think that his overall health is well enough at this point to proceed with these studies and they are agreeable to do so.  Continue iron supplements that have just been started by PCP yesterday.  Monitor Hgb periodically.  *Patient and his wife requesting Dr. Brodie since she is the wife's physician as well.  CC:  Kim, Carver, MD    

## 2014-09-04 NOTE — Patient Instructions (Signed)

## 2014-09-05 NOTE — Progress Notes (Signed)
Reviewed.  He did well with anesthesial for the TKR in Sept 2015 but has not seen a cardiologist since then. He should see Dr Sallyanne Kuster before having colonoscopy. Last acho was in 2010 but then he had cath in 02/2014.

## 2014-09-08 ENCOUNTER — Telehealth: Payer: Self-pay | Admitting: *Deleted

## 2014-09-08 ENCOUNTER — Telehealth: Payer: Self-pay | Admitting: Cardiovascular Disease

## 2014-09-08 NOTE — Telephone Encounter (Signed)
-----   Message from Loralie Champagne, PA-C sent at 09/05/2014  2:39 PM EDT ----- I saw this patient in the office the other day and scheduled him for EGD and colonoscopy per Dr. Olevia Perches.  She then signed my note and said "  He did well with anesthesial for the TKR in Sept 2015 but has not seen a cardiologist since then. He should see Dr Sallyanne Kuster before having colonoscopy. Last Sheldon Silvan was in 2010 but then he had cath in 02/2014."  Please have him see cardiology prior to his procedures at the end of April if possible.  Thank you,  Jess

## 2014-09-08 NOTE — Telephone Encounter (Signed)
Request for surgical clearance:  1. What type of surgery is being performed? Endoscopy and colonoscopy  2. When is this surgery scheduled? 4/27  3. Are there any medications that need to be held prior to surgery and how long?not specified  4. Name of physician performing surgery? Dr. Olevia Perches  5. What is your office phone and fax number? [phone] ask for Rollene Fare his nurse 6.

## 2014-09-08 NOTE — Telephone Encounter (Signed)
Spoke with [facility] and they will call back with OV for patient.

## 2014-09-08 NOTE — Telephone Encounter (Signed)
Low risk for colonoscopy and endoscopy. Please make sure he has f/u appt scheduled, he is due yearly visit

## 2014-09-09 ENCOUNTER — Ambulatory Visit (HOSPITAL_COMMUNITY): Payer: Medicare Other | Attending: Cardiology | Admitting: Cardiology

## 2014-09-09 DIAGNOSIS — I6523 Occlusion and stenosis of bilateral carotid arteries: Secondary | ICD-10-CM | POA: Insufficient documentation

## 2014-09-09 DIAGNOSIS — R0989 Other specified symptoms and signs involving the circulatory and respiratory systems: Secondary | ICD-10-CM

## 2014-09-09 NOTE — Progress Notes (Signed)
Carotid duplex performed 

## 2014-09-09 NOTE — Telephone Encounter (Signed)
Spoke with Dr. Lurline Del scheduler Dalene Seltzer and scheduled patient on 09/09/13 at 11:30 AM. Patient's wife given appointment.

## 2014-09-10 ENCOUNTER — Ambulatory Visit (INDEPENDENT_AMBULATORY_CARE_PROVIDER_SITE_OTHER): Payer: Medicare Other | Admitting: Cardiovascular Disease

## 2014-09-10 ENCOUNTER — Encounter: Payer: Self-pay | Admitting: Cardiovascular Disease

## 2014-09-10 VITALS — BP 104/60 | HR 60 | Resp 16 | Ht 66.0 in | Wt 198.1 lb

## 2014-09-10 DIAGNOSIS — D509 Iron deficiency anemia, unspecified: Secondary | ICD-10-CM

## 2014-09-10 DIAGNOSIS — I255 Ischemic cardiomyopathy: Secondary | ICD-10-CM

## 2014-09-10 DIAGNOSIS — I2510 Atherosclerotic heart disease of native coronary artery without angina pectoris: Secondary | ICD-10-CM

## 2014-09-10 DIAGNOSIS — E785 Hyperlipidemia, unspecified: Secondary | ICD-10-CM | POA: Diagnosis not present

## 2014-09-10 NOTE — Patient Instructions (Signed)
Dr. Croitoru recommends that you schedule a follow-up appointment in: One Year.   

## 2014-09-10 NOTE — Progress Notes (Signed)
Patient ID: Blake Patterson, male   DOB: Dec 01, 1926, 81 y.o.   MRN: [id number]     Cardiology Office Note   Date:  09/12/2014   ID:  Blake Patterson, DOB 07-Dec-1926, MRN [id number]  PCP:  Jani Gravel, MD  Cardiologist:   Sanda Klein, MD   Chief Complaint  Patient presents with  . Annual Exam    yearly. surg. clearence for EGD/Colonoscopy scheduled for 10/10/14      History of Present Illness: Blake Patterson is a 81 y.o. male who presents for follow up for CAD with asymptomatic ischemic cardiomyopathy (EF 40% after anterior MI and PCI/bare metal stent to LAD 2010). He has a remote history of LV apical thrombus, but is no longer on anticoagulation. Beta blockers were stopped due to bradycardia and conduction abnormalities. Nuclear scan in 2010 showed anterior scar and ischemia and repeat coronary angio in March 2015 showed no significant stenoses (40-50% distal left main, 40-50% LAD in-stent restenosis).  He has no cardiac complaints but has developed iron deficiency anemia and is due to undergo upper and lower GI endoscopy with Dr. Olevia Perches on April 27.    Past Medical History  Diagnosis Date  . CAD (coronary artery disease)   . Ischemic cardiomyopathy   . Dyslipidemia   . Cardiac conduction disorder   . Left ventricular apical thrombus   . MI (myocardial infarction)   . Presence of stent in artery   . Diabetes mellitus without complication   . Arthritis   . Cancer     skin    Past Surgical History  Procedure Laterality Date  . Cardiac catheterization  12/04/2008    Proximal and Distal LAD, stented w/ a non-drug-eluting 3.5x42m ZETA stent at 8atm, distally w/ a 3x141mVISION stent, resulting in reduction of 100% occlusion to less than 30%.  . Cardiovascular stress test  01/13/2009    Significant perfusion seen in Apical, Basal Anterior, Basal Anteroseptal, and Apical Anterior regions-consistent w/ infarct/scar. EKG negative for ischemia. No ECG changes. No significant ischemia  demonstrated.  . Transthoracic echocardiogram  03/25/2009    EF 35-45%, mild-moderate global hypokinesis  . Joint replacement      scopes bil knees  . Total shoulder arthroplasty Right 02/20/2014    Procedure: RIGHT TOTAL SHOULDER ARTHROPLASTY;  Surgeon: KeMarin ShutterMD;  [facility];  Laterality: Right;  . Left heart catheterization with coronary angiogram N/A 08/27/2013    Procedure: LEFT HEART CATHETERIZATION WITH CORONARY ANGIOGRAM;  Surgeon: ThTroy SineMD;  [facility];  Service: Cardiovascular;  Laterality: N/A;     Current Outpatient Prescriptions  Medication Sig Dispense Refill  . aspirin EC 81 MG tablet Take 81 mg by mouth daily.    . Marland KitchenINNAMON PO Take 1,000 mg by mouth daily.    . Coenzyme Q10 200 MG capsule Take 200 mg by mouth daily.    . famotidine (PEPCID) 40 MG tablet Take 1 tablet (40 mg total) by mouth daily. NEED APPOINTMENT FOR FUTURE REFILLS 30 tablet 0  . Garlic 106629G CAPS Take 1 capsule by mouth daily.    . Iron TABS Take 1 tablet by mouth daily.    . isosorbide mononitrate (IMDUR) 30 MG 24 hr tablet TAKE 1 TABLET (30 MG TOTAL) BY MOUTH DAILY. 30 tablet 7  . LIVALO 2 MG TABS Take 1 tablet by mouth every Monday, Wednesday, and Friday.     . losartan (COZAAR) 25 MG tablet TAKE 1 TABLET (25 MG TOTAL)  BY MOUTH DAILY. 30 tablet 7  . metFORMIN (GLUCOPHAGE) 500 MG tablet Take 250 mg by mouth daily with breakfast.    . MOVIPREP 100 G SOLR Take 1 kit (200 g total) by mouth once. 1 kit 0  . Multiple Vitamins-Minerals (CENTRUM SILVER ULTRA MENS PO) Take 1 tablet by mouth daily.    . Omega-3 Fatty Acids (FISH OIL) 1000 MG CAPS Take 1 capsule by mouth daily.     No current facility-administered medications for this visit.    Allergies:   Oxycodone    Social History:  The patient  reports that he quit smoking about 61 years ago. He has never used smokeless tobacco. He reports that he does not drink alcohol or use illicit drugs.    Family History:  The patient's family history includes Prostate cancer in his father.    ROS:  Please see the history of present illness.    Otherwise, review of systems positive for none.   All other systems are reviewed and negative.    PHYSICAL EXAM: VS:  BP 104/60 mmHg  Pulse 60  Resp 16  Ht '5\' 6"'  (1.676 m)  Wt 198 lb 1.6 oz (89.858 kg)  BMI 31.99 kg/m2 , BMI Body mass index is 31.99 kg/(m^2).  General: Alert, oriented x3, no distress Head: no evidence of trauma, PERRL, EOMI, no exophtalmos or lid lag, no myxedema, no xanthelasma; normal ears, nose and oropharynx Neck: normal jugular venous pulsations and no hepatojugular reflux; brisk carotid pulses without delay and no carotid bruits Chest: clear to auscultation, no signs of consolidation by percussion or palpation, normal fremitus, symmetrical and full respiratory excursions Cardiovascular: normal position and quality of the apical impulse, regular rhythm, normal first and second heart sounds, no murmurs, rubs or gallops Abdomen: no tenderness or distention, no masses by palpation, no abnormal pulsatility or arterial bruits, normal bowel sounds, no hepatosplenomegaly Extremities: no clubbing, cyanosis or edema; 2+ radial, ulnar and brachial pulses bilaterally; 2+ right femoral, posterior tibial and dorsalis pedis pulses; 2+ left femoral, posterior tibial and dorsalis pedis pulses; no subclavian or femoral bruits Neurological: grossly nonfocal Psych: euthymic mood, full affect   EKG:  EKG is ordered today. The ekg ordered today demonstrates NSR, long 1st degree AV block 312 ms, one PVC, QS V1-V2, QTc 414 ms   Recent Labs: 02/18/2014: ALT 23 02/21/2014: BUN 24*; Creatinine 0.98; Hemoglobin 12.8*; Platelets 90*; Potassium 4.5; Sodium 138    Lipid Panel    Component Value Date/Time   CHOL 146 08/28/2013 0417   TRIG 126 08/28/2013 0417   HDL 57 08/28/2013 0417   CHOLHDL 2.6 08/28/2013 0417   VLDL 25 08/28/2013 0417    LDLCALC 64 08/28/2013 0417      Wt Readings from Last 3 Encounters:  09/10/14 198 lb 1.6 oz (89.858 kg)  09/04/14 200 lb (90.719 kg)  02/20/14 183 lb (83.008 kg)      Other studies Reviewed: Additional studies/ records that were reviewed today include: coronary angio 2015. Review of the above records demonstrates: see notes   ASSESSMENT AND PLAN:  1. CAD s/p large anterior MI, no severe stenoses by angio one year ago and free of angina. No change  meds  2. Moderate ischemic cardiomyopathy without clinical CHF (EDP 19 mm Hg at cath 2015). On ARB, not requiring diuretics.  3. AV conduction disease - intolerant to beta blocker  4. Fe deficiency anemia - low risk of CV complications with planned endoscopy April 27  5. Hyperlipidemia on  statin -labs next Monday with Dr. Maudie Mercury   Current medicines are reviewed at length with the patient today.  The patient does not have concerns regarding medicines.  The following changes have been made:  no change  Labs/ tests ordered today include:  No orders of the defined types were placed in this encounter.    Patient Instructions  Dr. Sallyanne Kuster recommends that you schedule a follow-up appointment in: One Year.      Mikael Spray, MD  09/12/2014 6:00 PM    Sanda Klein, MD, [facility] [phone] office [phone] pager

## 2014-09-11 ENCOUNTER — Encounter: Payer: Self-pay | Admitting: *Deleted

## 2014-09-11 NOTE — Telephone Encounter (Signed)
Got in touch w/ Rollene Fare from [facility], letter faxed. Will route message to scheduling for annual OV.

## 2014-09-12 ENCOUNTER — Encounter: Payer: Self-pay | Admitting: Cardiovascular Disease

## 2014-09-12 ENCOUNTER — Telehealth: Payer: Self-pay | Admitting: *Deleted

## 2014-09-12 NOTE — Telephone Encounter (Signed)
Received a call from Dr. Sallyanne Kuster that patient is cleared for colonoscopy and Endoscopy. Letter received today stating this. Sent to be scanned into system.

## 2014-09-20 ENCOUNTER — Other Ambulatory Visit: Payer: Self-pay | Admitting: Cardiovascular Disease

## 2014-10-08 ENCOUNTER — Encounter: Payer: Self-pay | Admitting: Internal Medicine

## 2014-10-08 ENCOUNTER — Ambulatory Visit (AMBULATORY_SURGERY_CENTER): Payer: Medicare Other | Admitting: Internal Medicine

## 2014-10-08 VITALS — BP 161/81 | HR 68 | Temp 96.1°F | Resp 21 | Ht 66.0 in | Wt 200.0 lb

## 2014-10-08 DIAGNOSIS — D120 Benign neoplasm of cecum: Secondary | ICD-10-CM

## 2014-10-08 DIAGNOSIS — D125 Benign neoplasm of sigmoid colon: Secondary | ICD-10-CM

## 2014-10-08 DIAGNOSIS — K317 Polyp of stomach and duodenum: Secondary | ICD-10-CM | POA: Diagnosis not present

## 2014-10-08 DIAGNOSIS — D509 Iron deficiency anemia, unspecified: Secondary | ICD-10-CM

## 2014-10-08 DIAGNOSIS — K635 Polyp of colon: Secondary | ICD-10-CM

## 2014-10-08 DIAGNOSIS — K639 Disease of intestine, unspecified: Secondary | ICD-10-CM

## 2014-10-08 DIAGNOSIS — K6389 Other specified diseases of intestine: Secondary | ICD-10-CM

## 2014-10-08 DIAGNOSIS — K2970 Gastritis, unspecified, without bleeding: Secondary | ICD-10-CM

## 2014-10-08 LAB — GLUCOSE, CAPILLARY
GLUCOSE-CAPILLARY: 85 mg/dL (ref 70–99)
Glucose-Capillary: 84 mg/dL (ref 70–99)

## 2014-10-08 MED ORDER — SODIUM CHLORIDE 0.9 % IV SOLN: 500.0000 mL | INTRAVENOUS | Status: DC

## 2014-10-08 NOTE — Progress Notes (Signed)
  [facility] Post-op Note  Patient: Blake Patterson  Procedure(s) Performed: colonoscopy and endoscopy  Patient Location: [facility] - Recovery Area  Anesthesia Type: Deep Sedation/Propofol  Level of Consciousness: awake, oriented and patient cooperative  Airway and Oxygen Therapy: Patient Spontanous Breathing  Post-op Pain: none  Post-op Assessment:  Post-op Vital signs reviewed, Patient's Cardiovascular Status Stable, Respiratory Function Stable, Patent Airway, No signs of Nausea or vomiting and Pain level controlled  Post-op Vital Signs: Reviewed and stable  Complications: No apparent anesthesia complications  Beya Tipps E 5:01 PM

## 2014-10-08 NOTE — Progress Notes (Signed)
Called to room to assist during endoscopic procedure.  Patient ID and intended procedure confirmed with present staff. Received instructions for my participation in the procedure from the performing physician.  

## 2014-10-08 NOTE — Patient Instructions (Addendum)
Stomach polyp,colon mass, and colon polyps removed today. Hemorrhoids seen and diverticulosis. Handouts given. Metal clip placed in colon, MRI information card given to you today.  Dr.Brodie's nurse will call you with CT appointment, and office visit. Blood work to be done Architectural technologist.  Resume Iron supplement. Hold aspirin until further notified.    YOU HAD AN ENDOSCOPIC PROCEDURE TODAY AT [facility]:   Refer to the procedure report that was given to you for any specific questions about what was found during the examination.  If the procedure report does not answer your questions, please call your gastroenterologist to clarify.  If you requested that your care partner not be given the details of your procedure findings, then the procedure report has been included in a sealed envelope for you to review at your convenience later.  YOU SHOULD EXPECT: Some feelings of bloating in the abdomen. Passage of more gas than usual.  Walking can help get rid of the air that was put into your GI tract during the procedure and reduce the bloating. If you had a lower endoscopy (such as a colonoscopy or flexible sigmoidoscopy) you may notice spotting of blood in your stool or on the toilet paper. If you underwent a bowel prep for your procedure, you may not have a normal bowel movement for a few days.  Please Note:  You might notice some irritation and congestion in your nose or some drainage.  This is from the oxygen used during your procedure.  There is no need for concern and it should clear up in a day or so.  SYMPTOMS TO REPORT IMMEDIATELY:   Following lower endoscopy (colonoscopy or flexible sigmoidoscopy):  Excessive amounts of blood in the stool  Significant tenderness or worsening of abdominal pains  Swelling of the abdomen that is new, acute  Fever of 100F or higher   Following upper endoscopy (EGD)  Vomiting of blood or coffee ground material  New chest pain or pain under the  shoulder blades  Painful or persistently difficult swallowing  New shortness of breath  Fever of 100F or higher  Black, tarry-looking stools  For urgent or emergent issues, a gastroenterologist can be reached at any hour by calling [phone].   DIET: Your first meal following the procedure should be a small meal and then it is ok to progress to your normal diet. Heavy or fried foods are harder to digest and may make you feel nauseous or bloated.  Likewise, meals heavy in dairy and vegetables can increase bloating.  Drink plenty of fluids but you should avoid alcoholic beverages for 24 hours.  ACTIVITY:  You should plan to take it easy for the rest of today and you should NOT DRIVE or use heavy machinery until tomorrow (because of the sedation medicines used during the test).    FOLLOW UP: Our staff will call the number listed on your records the next business day following your procedure to check on you and address any questions or concerns that you may have regarding the information given to you following your procedure. If we do not reach you, we will leave a message.  However, if you are feeling well and you are not experiencing any problems, there is no need to return our call.  We will assume that you have returned to your regular daily activities without incident.  If any biopsies were taken you will be contacted by phone or by letter within the next 1-3 weeks.  Please call  us at [phone] if you have not heard about the biopsies in 3 weeks.    SIGNATURES/CONFIDENTIALITY: You and/or your care partner have signed paperwork which will be entered into your electronic medical record.  These signatures attest to the fact that that the information above on your After Visit Summary has been reviewed and is understood.  Full responsibility of the confidentiality of this discharge information lies with you and/or your care-partner.

## 2014-10-08 NOTE — Op Note (Signed)
[place]  [address] [place], [address]   COLONOSCOPY PROCEDURE REPORT  PATIENT: Blake, Patterson  MR#: [id number] BIRTHDATE: [birth year] , 89  yrs. old GENDER: male ENDOSCOPIST: Lafayette Dragon, MD REFERRED IZ:TIWPY Kim, M.D. PROCEDURE DATE:  10/08/2014 PROCEDURE:   Colonoscopy, diagnostic, Colonoscopy with biopsy, Colonoscopy with snare polypectomy, Colonoscopy with cold biopsy polypectomy, and Submucosal injection, any substance First Screening Colonoscopy - Avg.  risk and is 50 yrs.  old or older - No.  Prior Negative Screening - Now for repeat screening. N/A  History of Adenoma - Now for follow-up colonoscopy & has been > or = to 3 yrs.  N/A ASA CLASS:   Class III INDICATIONS:asymptomatic iron deficiency anemia.  Hemoglobin has dropped from 13.8-9.9.  MCV 77.Marland Kitchen MEDICATIONS: Monitored anesthesia care and Propofol 100 mg IV  DESCRIPTION OF PROCEDURE:   After the risks benefits and alternatives of the procedure were thoroughly explained, informed consent was obtained.  The digital rectal exam revealed no abnormalities of the rectum.   The LB [id number]  endoscope was introduced through the anus and advanced to the cecum, which was identified by both the appendix and ileocecal valve. No adverse events experienced.   The quality of the prep was good.  (MoviPrep was used)  The instrument was then slowly withdrawn as the colon was fully examined.      COLON FINDINGS: A one-third circumferential medium ulcerated mass with friable surfaces was found in the ascending colon.  Multiple biopsies were performed using cold forceps.   Small internal Grade I hemorrhoids were found.tattoo was applied to 3 quadrants of the mass. Endoclips were applied to the mass to mark the location.there were a total of 4 polyps 25 mm polyps in the cecum were removed with cold snare with the cold biopsies revealed a 1 cm pedunculated polyp was removed from descending colon but  not recovered. 6 mm sessile polyp in sigmoid colon at 30 cm was removed with the cold biopsies. There was some mild to moderate diverticulosis of the sigmoid colon Retroflexed views revealed no abnormalities. The time to cecum = 5.27 Withdrawal time = 21.46   The scope was withdrawn and the procedure completed. COMPLICATIONS: There were no immediate complications.  ENDOSCOPIC IMPRESSION: 1.   One-third circumferential fiable infiltrating  mass was found in the ascending colon; multiple biopsies were performed using cold forceps , tatoo applied in 3 quadrants, 2 Enoclips placed at the margin of the mass 2.   Small internal Grade I hemorrhoids 3. 4 polyps removed, 2 from the cecum. One from the descending colon and one from the sigmoid colon 4. Mild-to-moderate diverticulosis of the sigmoid and descending colon   RECOMMENDATIONS: 1.  Await pathology results 2.  Obtain CT scan of the abdomen and pelvis to rule out metastatic disease obtain KUB for placement and location of the endoclips Office visit to discuss all pathology and plans for further care Resume iron supplements Lab work eluding CBC, metabolic panel, CEA levels  eSigned:  Lafayette Dragon, MD 10/08/2014 5:17 PM   cc:   PATIENT NAME:  Blake, Patterson MR#: [id number]

## 2014-10-08 NOTE — Op Note (Signed)
[place]  [address]. [place], [address]   ENDOSCOPY PROCEDURE REPORT  PATIENT: Blake Patterson, Blake Patterson  MR#: [id number] BIRTHDATE: [birth year] , 50  yrs. old GENDER: male ENDOSCOPIST: Lafayette Dragon, MD REFERRED BY:  Jani Gravel, M.D. PROCEDURE DATE:  10/08/2014 PROCEDURE:  EGD w/ snare polypectomy ASA CLASS:     Class III INDICATIONS:  Iron deficiency anemia.  Hemoglobin dropped from 13.8-9.9.  MCV 77. MEDICATIONS: Monitored anesthesia care and Propofol 100 mg IV TOPICAL ANESTHETIC: none  DESCRIPTION OF PROCEDURE: After the risks benefits and alternatives of the procedure were thoroughly explained, informed consent was obtained.  The LB [id number] endoscope was introduced through the mouth and advanced to the second portion of the duodenum , Without limitations.  The instrument was slowly withdrawn as the mucosa was fully examined.    [Esophagus: esophageal mucosa was normal in the proximal mid and distal esophagus. There was no stricture or hiatal hernia  Stomach: 1 cm distal from the squamocolumnar junction was a sessile hyperemic and friable polyp which measured 15 mm in diameter. The rest of the stomach appeared normal with mild erythema in the gastric antrum and pyloric outlet. Retroflexion of the endoscope confirmed presence of gastric polyp area polyp was removed with a snare cautery and sent to pathology Duodenum:   duodenal bulb and descending duodenum was normal The scope was then withdrawn from the patient and the procedure completed.  COMPLICATIONS: There were no immediate complications.  ENDOSCOPIC IMPRESSION: gastric polyp in the cardia. Status post snare cautery  polypectomy. Polyp appeared friable and may be source of the bleeding  RECOMMENDATIONS: 1.  Await pathology results 2.  Proceed with colonoscopy  REPEAT EXAM: for EGD pending biopsy results.  eSigned:  Lafayette Dragon, MD 10/08/2014 5:08 PM    CC:  PATIENT NAME:   Rawlins, Stuard MR#: [id number]

## 2014-10-09 ENCOUNTER — Telehealth: Payer: Self-pay | Admitting: *Deleted

## 2014-10-09 ENCOUNTER — Other Ambulatory Visit: Payer: Self-pay | Admitting: Cardiovascular Disease

## 2014-10-09 ENCOUNTER — Encounter: Payer: Self-pay | Admitting: *Deleted

## 2014-10-09 ENCOUNTER — Other Ambulatory Visit: Payer: Self-pay | Admitting: *Deleted

## 2014-10-09 ENCOUNTER — Other Ambulatory Visit (INDEPENDENT_AMBULATORY_CARE_PROVIDER_SITE_OTHER): Payer: Medicare Other

## 2014-10-09 DIAGNOSIS — K6389 Other specified diseases of intestine: Secondary | ICD-10-CM

## 2014-10-09 LAB — CBC WITH DIFFERENTIAL/PLATELET
BASOS PCT: 0.6 % (ref 0.0–3.0)
Basophils Absolute: 0 10*3/uL (ref 0.0–0.1)
EOS ABS: 0.3 10*3/uL (ref 0.0–0.7)
EOS PCT: 4.1 % (ref 0.0–5.0)
HCT: 38.2 % — ABNORMAL LOW (ref 39.0–52.0)
Hemoglobin: 12.1 g/dL — ABNORMAL LOW (ref 13.0–17.0)
LYMPHS ABS: 1.2 10*3/uL (ref 0.7–4.0)
LYMPHS PCT: 18.3 % (ref 12.0–46.0)
MCHC: 31.9 g/dL (ref 30.0–36.0)
MCV: 77.9 fl — ABNORMAL LOW (ref 78.0–100.0)
MONO ABS: 0.5 10*3/uL (ref 0.1–1.0)
MONOS PCT: 8 % (ref 3.0–12.0)
Neutro Abs: 4.4 10*3/uL (ref 1.4–7.7)
Neutrophils Relative %: 69 % (ref 43.0–77.0)
Platelets: 173 10*3/uL (ref 150.0–400.0)
RBC: 4.9 Mil/uL (ref 4.22–5.81)
RDW: 26.3 % — AB (ref 11.5–15.5)
WBC: 6.4 10*3/uL (ref 4.0–10.5)

## 2014-10-09 LAB — COMPREHENSIVE METABOLIC PANEL
ALBUMIN: 4.1 g/dL (ref 3.5–5.2)
ALK PHOS: 73 U/L (ref 39–117)
ALT: 18 U/L (ref 0–53)
AST: 23 U/L (ref 0–37)
BUN: 19 mg/dL (ref 6–23)
CO2: 23 mEq/L (ref 19–32)
Calcium: 9.1 mg/dL (ref 8.4–10.5)
Chloride: 111 mEq/L (ref 96–112)
Creatinine, Ser: 1.1 mg/dL (ref 0.40–1.50)
GFR: 67.41 mL/min (ref >60.00)
Glucose, Bld: 104 mg/dL — ABNORMAL HIGH (ref 70–99)
POTASSIUM: 4.2 meq/L (ref 3.5–5.1)
SODIUM: 141 meq/L (ref 135–145)
TOTAL PROTEIN: 6.5 g/dL (ref 6.0–8.3)
Total Bilirubin: 0.6 mg/dL (ref 0.2–1.2)

## 2014-10-09 NOTE — Telephone Encounter (Signed)
Rx(s) sent to pharmacy electronically.  

## 2014-10-09 NOTE — Telephone Encounter (Signed)
Spoke with Sonya at [facility] and scheduled patient on 10/21/14 at 9:10/9:30 AM with Dr. Marcello Moores. Patient's wife notified of appointment.

## 2014-10-09 NOTE — Telephone Encounter (Signed)
-----   Message from Lafayette Dragon, MD sent at 10/09/2014  2:12 PM EDT ----- Wynn Banker on Tuesday or Friday. I will talk to thme after the CT scan and will  Probable refer him directly to a surgeon. Please call [facility] to arrange for a surgical consult. He will need KUB  In addition to CT scan for placement of Endoclips. ----- Message -----    From: Hulan Saas, RN    Sent: 10/09/2014   1:51 PM      To: Lafayette Dragon, MD  Dr. Olevia Perches, This patient is having the CT and xray on Monday. The next OV I have with you is on 10/23/14 at 1:15 PM. Is this ok or do you want me to overbook on Tuesday or Friday.  Rollene Fare

## 2014-10-09 NOTE — Telephone Encounter (Signed)
Moved appointment to 10/14/14 at 3:00 PM. Patient's wife given appointment. Patient has not seen a surgeon and per wife wants the one Dr. Olevia Perches prefers. Left a message with Sonya at [facility]. (infiltrating mass in ascending colon- tattoo applied and endo clips at margin of mass)

## 2014-10-09 NOTE — Telephone Encounter (Signed)
  Follow up Call-  Call back number 10/08/2014  Post procedure Call Back phone  # [phone]  Permission to leave phone message Yes     Patient questions:  Do you have a fever, pain , or abdominal swelling? No. Pain Score  0 *  Have you tolerated food without any problems? Yes.    Have you been able to return to your normal activities? Yes.    Do you have any questions about your discharge instructions: Diet   No. Medications  No. Follow up visit  No.  Do you have questions or concerns about your Care? No.  Actions: * If pain score is 4 or above: No action needed, pain <4.  Information provided via wife.

## 2014-10-10 LAB — CEA: CEA: 2.4 ng/mL (ref 0.0–5.0)

## 2014-10-13 ENCOUNTER — Encounter (HOSPITAL_COMMUNITY): Payer: Self-pay

## 2014-10-13 ENCOUNTER — Ambulatory Visit (HOSPITAL_COMMUNITY)
Admission: RE | Admit: 2014-10-13 | Discharge: 2014-10-13 | Disposition: A | Discharge status: Home / Self Care | Payer: Medicare Other | Source: Ambulatory Visit | Attending: Internal Medicine | Admitting: Internal Medicine

## 2014-10-13 ENCOUNTER — Ambulatory Visit (HOSPITAL_COMMUNITY): Admission: RE | Admit: 2014-10-13 | Payer: Medicare Other | Source: Ambulatory Visit

## 2014-10-13 ENCOUNTER — Other Ambulatory Visit: Payer: Self-pay | Admitting: Internal Medicine

## 2014-10-13 DIAGNOSIS — K6389 Other specified diseases of intestine: Secondary | ICD-10-CM

## 2014-10-13 DIAGNOSIS — R1909 Other intra-abdominal and pelvic swelling, mass and lump: Secondary | ICD-10-CM | POA: Diagnosis present

## 2014-10-13 MED ORDER — IOHEXOL 300 MG/ML  SOLN
100.0000 mL | Freq: Once | INTRAMUSCULAR | Status: AC | PRN
  Administered 2014-10-13: 100 mL via INTRAVENOUS

## 2014-10-14 ENCOUNTER — Ambulatory Visit (INDEPENDENT_AMBULATORY_CARE_PROVIDER_SITE_OTHER): Payer: Medicare Other | Admitting: Internal Medicine

## 2014-10-14 ENCOUNTER — Encounter: Payer: Self-pay | Admitting: Internal Medicine

## 2014-10-14 VITALS — BP 128/70 | HR 60 | Ht 66.0 in | Wt 198.2 lb

## 2014-10-14 DIAGNOSIS — C189 Malignant neoplasm of colon, unspecified: Secondary | ICD-10-CM

## 2014-10-14 DIAGNOSIS — I255 Ischemic cardiomyopathy: Secondary | ICD-10-CM

## 2014-10-14 DIAGNOSIS — R935 Abnormal findings on diagnostic imaging of other abdominal regions, including retroperitoneum: Secondary | ICD-10-CM

## 2014-10-14 NOTE — Progress Notes (Signed)
Braylin Xu December 27, 1926 [id number]  Note: This dictation was prepared with Dragon digital system. Any transcriptional errors that result from this procedure are unintentional.   History of Present Illness: This is a 81 year old white male with the new diagnosis of a non obstructing   mass at the hepatic flexure found on colonoscopy on 10/08/2014. The mass was a one third circumferential,friable, ulcerated  and it was marked with spot tattoo and metal clips for future resection.. KUB confirmed presence of the metal  clip in the right upper quadrant.(Path report showed at least high-grade dysplasia).. CT scan of the abdomen confirmed circumferential thickening at the hepatic flexure. We have recommended surgical resection hopefully laparoscopically assisted. Patient has a history of coronary artery disease stent placement. Last ejection fraction 40%. He is borderline diabetic. Last cardiac catheterization March 2015. Followed by Dr. Sallyanne Kuster. He may need a cardiac clearance prior to surgery. He has been on iron supplements . CEA level 24. Hemoglobin 12.1. He came today with his family to discuss disposition.    Past Medical History  Diagnosis Date  . CAD (coronary artery disease)   . Ischemic cardiomyopathy   . Dyslipidemia   . Cardiac conduction disorder   . Left ventricular apical thrombus   . Presence of stent in artery   . Arthritis   . MI (myocardial infarction)     2010  . Cancer     skin  . Diabetes mellitus without complication   . Colon polyps     Past Surgical History  Procedure Laterality Date  . Cardiac catheterization  12/04/2008    Proximal and Distal LAD, stented w/ a non-drug-eluting 3.5x86mm ZETA stent at 8atm, distally w/ a 3x49mm VISION stent, resulting in reduction of 100% occlusion to less than 30%.  . Cardiovascular stress test  01/13/2009    Significant perfusion seen in Apical, Basal Anterior, Basal Anteroseptal, and Apical Anterior regions-consistent w/  infarct/scar. EKG negative for ischemia. No ECG changes. No significant ischemia demonstrated.  . Transthoracic echocardiogram  03/25/2009    EF 35-45%, mild-moderate global hypokinesis  . Joint replacement      scopes bil knees  . Total shoulder arthroplasty Right 02/20/2014    Procedure: RIGHT TOTAL SHOULDER ARTHROPLASTY;  Surgeon: Marin Shutter, MD;  Location: [facility];  Service: Orthopedics;  Laterality: Right;  . Left heart catheterization with coronary angiogram N/A 08/27/2013    Procedure: LEFT HEART CATHETERIZATION WITH CORONARY ANGIOGRAM;  Surgeon: Troy Sine, MD;  Location: [facility];  Service: Cardiovascular;  Laterality: N/A;    Allergies  Allergen Reactions  . Oxycodone     "makes him crazy"     Family history and social history have been reviewed.  Review of Systems: Patient denies Donald pain. Constipation.  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 CT scan confirms mass. He has iron deficiency anemia likely secondary to slow GI bleed. He has an appointment with  surgery Dr. Grandville Silos on May 10 at 9 AM. He will continue iron supplements. We have talked today with his family about the surgical procedure. Hospitalization. Possible risk of surgery. He wants to go ahead with the surgery    Delfin Edis 10/14/2014

## 2014-10-14 NOTE — Patient Instructions (Addendum)
Please keep your appointment with [facility] on 10/21/14 at 9:30am arriving at 9:10 for registration.   Dr Maudie Mercury, Dt [place], Dr Grandville Silos [facility]

## 2014-10-21 ENCOUNTER — Other Ambulatory Visit: Payer: Self-pay | Admitting: Cardiovascular Disease

## 2014-10-21 ENCOUNTER — Other Ambulatory Visit: Payer: Self-pay | Admitting: General Surgery

## 2014-10-21 NOTE — H&P (Signed)
Blake Patterson 10/21/2014 9:46 AM Location: [facility] Patient #: [id number] DOB: 06/06/1927 Married / Language: [name] / Race: White Male History of Present Illness [name] MD; 1/44/3154 10:39 AM) Patient words: colon ca.  The patient is a 81 year old male who presents with colorectal cancer. A [age]-year-old male who was referred to me as a new patient. He was noted to have anemia on his blood work during his physical exam. He is referred to Dr. Maurene Capes for colonoscopy. During the colonoscopy a mass was noted at his hepatic flexure. Biopsies were performed. This showed at least high-grade dysplasia and concerning for invasive adenocarcinoma. This was marked with spot tattoo. CT scan of the abdomen showed circumferential thickening at the hepatic flexure. There was a prominent lymph node in that same area. There was no other signs of metastatic disease. CEA level was 2.4. He is here today to discuss surgical resection. He has no history of surgery within his abdomen. He does have a history of an MI in 2010 for which a stent was placed. He is followed by Dr. Sallyanne Kuster. His last catheterization was in March 2015. He denies any chest pain currently. Last ejection fraction was 40%. Other Problems [name], LPN; 0/01/6760 9:50 AM) Diverticulosis Kidney Stone Myocardial infarction  Past Surgical History [name], LPN; 9/32/6712 4:58 AM) Knee Surgery Bilateral. Shoulder Surgery Right.  Diagnostic Studies History [name], LPN; 0/99/8338 2:50 AM) Colonoscopy within last year  Allergies [name], LPN; 5/39/7673 4:19 AM) OxyCODONE HCl *ANALGESICS - OPIOID*  Medication History [name], LPN; 3/79/0240 9:73 AM) Famotidine (40MG  Tablet, Oral) Active. Isosorbide Mononitrate ER (30MG  Tablet ER 24HR, Oral) Active. Livalo (2MG  Tablet, Oral) Active. MetFORMIN HCl (500MG  Tablet, Oral) Active. Losartan Potassium (25MG  Tablet, Oral)  Active. Cinnamon (500MG  Tablet, Oral) Active. Garlic (Oral) Active. Co Q 10 (100MG  Capsule, 2 capsules Oral) Active. Iron (Ferrous Gluconate) (325MG  Tablet, Oral) Active. Multivitamin Adults 50+ (Oral) Active. Fish Oil (1000MG  Capsule DR, Oral) Active. Medications Reconciled  Social History [name], LPN; 5/32/9924 2:68 AM) Caffeine use Coffee, Tea. No alcohol use No drug use Tobacco use Never smoker.  Family History [name], LPN; 3/41/9622 2:97 AM) Heart Disease Brother. Respiratory Condition Brother.     Review of Systems [name] Dockery LPN; 9/89/2119 4:17 AM) General Not Present- Appetite Loss, Chills, Fatigue, Fever, Night Sweats, Weight Gain and Weight Loss. Skin Not Present- Change in Wart/Mole, Dryness, Hives, Jaundice, New Lesions, Non-Healing Wounds, Rash and Ulcer. HEENT Present- Hearing Loss and Wears glasses/contact lenses. Not Present- Earache, Hoarseness, Nose Bleed, Oral Ulcers, Ringing in the Ears, Seasonal Allergies, Sinus Pain, Sore Throat, Visual Disturbances and Yellow Eyes. Respiratory Present- Snoring. Not Present- Bloody sputum, Chronic Cough, Difficulty Breathing and Wheezing. Gastrointestinal Present- Hemorrhoids. Not Present- Abdominal Pain, Bloating, Bloody Stool, Change in Bowel Habits, Chronic diarrhea, Constipation, Difficulty Swallowing, Excessive gas, Gets full quickly at meals, Indigestion, Nausea, Rectal Pain and Vomiting. Male Genitourinary Not Present- Blood in Urine, Change in Urinary Stream, Frequency, Impotence, Nocturia, Painful Urination, Urgency and Urine Leakage. Musculoskeletal Present- Joint Pain. Not Present- Back Pain, Joint Stiffness, Muscle Pain, Muscle Weakness and Swelling of Extremities. Neurological Present- Trouble walking. Not Present- Decreased Memory, Fainting, Headaches, Numbness, Seizures, Tingling, Tremor and Weakness. Psychiatric Not Present- Anxiety, Bipolar, Change in Sleep Pattern, Depression,  Fearful and Frequent crying. Endocrine Not Present- Cold Intolerance, Excessive Hunger, Hair Changes, Heat Intolerance, Hot flashes and New Diabetes. Hematology Present- Easy Bruising. Not Present- Excessive bleeding, Gland problems, HIV and Persistent Infections.  Vitals (  [name] LPN; 6/94/8546 2:70 AM) 10/21/2014 9:47 AM Weight: 200.4 lb Height: 66in Body Surface Area: 2.06 m Body Mass Index: 32.35 kg/m Temp.: 97.55F(Oral)  Pulse: 68 (Regular)  BP: 120/74 (Sitting, Left Arm, Standard)     Physical Exam [name] MD; 3/50/0938 10:39 AM)  General Mental Status-Alert. General Appearance-Consistent with stated age. Hydration-Well hydrated. Voice-Normal.  Head and Neck Head-normocephalic, atraumatic with no lesions or palpable masses. Trachea-midline. Thyroid Gland Characteristics - normal size and consistency.  Eye Eyeball - Bilateral-Extraocular movements intact. Sclera/Conjunctiva - Bilateral-No scleral icterus.  Chest and Lung Exam Chest and lung exam reveals -quiet, even and easy respiratory effort with no use of accessory muscles and on auscultation, normal breath sounds, no adventitious sounds and normal vocal resonance. Inspection Chest Wall - Normal. Back - normal.  Cardiovascular Cardiovascular examination reveals -normal heart sounds, regular rate and rhythm with no murmurs and normal pedal pulses bilaterally.  Abdomen Inspection Inspection of the abdomen reveals - No Hernias. Palpation/Percussion Palpation and Percussion of the abdomen reveal - Soft, Non Tender, No Rebound tenderness, No Rigidity (guarding) and No hepatosplenomegaly. Auscultation Auscultation of the abdomen reveals - Bowel sounds normal.  Neurologic Neurologic evaluation reveals -alert and oriented x 3 with no impairment of recent or remote memory. Mental Status-Normal.  Musculoskeletal Global Assessment -Note:no gross  deformities.  Normal Exam - Left-Upper Extremity Strength Normal and Lower Extremity Strength Normal. Normal Exam - Right-Upper Extremity Strength Normal and Lower Extremity Strength Normal.    Assessment & Plan [name] MD; 1/82/9937 10:40 AM)  COLON POLYP (211.3  K63.5) Impression: This 81 year old male with a hepatic flexure colon mass, most likely invasive adenocarcinoma on the biopsies only showed high-grade dysplasia at the moment. The mass has been tattooed. I have recommended a minimally invasive colectomy. This will most likely be a formal RIGHT colectomy. He may need an extended RIGHT colectomy, but I think this is unlikely. I will touch base with his cardiologist prior to scheduling surgery. The surgery and anatomy were described to the patient as well as the risks of surgery and the possible complications. These include: Bleeding, deep abdominal infections and possible wound complications such as hernia and infection, damage to adjacent structures, leak of surgical connections, which can lead to other surgeries and possibly an ostomy, possible need for other procedures, such as abscess drains in radiology, possible prolonged hospital stay, possible diarrhea from removal of part of the colon, possible constipation from narcotics, possible bowel, bladder or sexual dysfunction if having rectal surgery, prolonged fatigue/weakness or appetite loss, possible early recurrence of of disease, possible complications of their medical problems such as heart disease or arrhythmias or lung problems, death (less than 1%). I believe the patient understands and wishes to proceed with the surgery.

## 2014-10-21 NOTE — Telephone Encounter (Signed)
Rx(s) sent to pharmacy electronically.  

## 2014-10-23 ENCOUNTER — Telehealth: Payer: Self-pay | Admitting: *Deleted

## 2014-10-23 ENCOUNTER — Ambulatory Visit: Payer: PRIVATE HEALTH INSURANCE | Admitting: Internal Medicine

## 2014-10-23 ENCOUNTER — Encounter: Payer: Self-pay | Admitting: Cardiovascular Disease

## 2014-10-23 NOTE — Telephone Encounter (Signed)
Signed surgical clearance faxed to [facility] Dr. Marcello Moores. Low to moderate risk from a cardiac standpoint.

## 2014-10-28 NOTE — Patient Instructions (Signed)
Blake Patterson  10/28/2014   Your procedure is scheduled on:      10/30/2014    Report to [facility] Main  Entrance and follow signs to               [place] at     1045 AM.  Call this number if you have problems the morning of surgery [phone]   Remember: ONLY 1 PERSON MAY GO WITH YOU TO SHORT STAY TO GET  READY MORNING OF [place].  Do not eat food or drink liquids :After Midnight.     Take these medicines the morning of surgery with A SIP OF WATER:  Imdur ( Isosorbide Mononitrate), Pepcid                                You may not have any metal on your body including hair pins and              piercings  Do not wear jewelry,  lotions, powders or perfumes, deodorant                         Men may shave face and neck.   Do not bring valuables to the hospital. [place].  Contacts, dentures or bridgework may not be worn into surgery.  Leave suitcase in the car. After surgery it may be brought to your room.       Special Instructions: coughing and deep breathing exercises, leg exercises               Please read over the following fact sheets you were given: _____________________________________________________________________             [facility] - Preparing for Surgery Before surgery, you can play an important role.  Because skin is not sterile, your skin needs to be as free of germs as possible.  You can reduce the number of germs on your skin by washing with CHG (chlorahexidine gluconate) soap before surgery.  CHG is an antiseptic cleaner which kills germs and bonds with the skin to continue killing germs even after washing. Please DO NOT use if you have an allergy to CHG or antibacterial soaps.  If your skin becomes reddened/irritated stop using the CHG and inform your nurse when you arrive at Short Stay. Do not shave (including legs and underarms) for at least 48 hours prior to the  first CHG shower.  You may shave your face/neck. Please follow these instructions carefully:  1.  Shower with CHG Soap the night before surgery and the  morning of Surgery.  2.  If you choose to wash your hair, wash your hair first as usual with your  normal  shampoo.  3.  After you shampoo, rinse your hair and body thoroughly to remove the  shampoo.                           4.  Use CHG as you would any other liquid soap.  You can apply chg directly  to the skin and wash  Gently with a scrungie or clean washcloth.  5.  Apply the CHG Soap to your body ONLY FROM THE NECK DOWN.   Do not use on face/ open                           Wound or open sores. Avoid contact with eyes, ears mouth and genitals (private parts).                       Wash face,  Genitals (private parts) with your normal soap.             6.  Wash thoroughly, paying special attention to the area where your surgery  will be performed.  7.  Thoroughly rinse your body with warm water from the neck down.  8.  DO NOT shower/wash with your normal soap after using and rinsing off  the CHG Soap.                9.  Pat yourself dry with a clean towel.            10.  Wear clean pajamas.            11.  Place clean sheets on your bed the night of your first shower and do not  sleep with pets. Day of Surgery : Do not apply any lotions/deodorants the morning of surgery.  Please wear clean clothes to the hospital/surgery center.  FAILURE TO FOLLOW THESE INSTRUCTIONS MAY RESULT IN THE CANCELLATION OF YOUR SURGERY PATIENT SIGNATURE_________________________________  NURSE SIGNATURE__________________________________  ________________________________________________________________________  WHAT IS A BLOOD TRANSFUSION? Blood Transfusion Information  A transfusion is the replacement of blood or some of its parts. Blood is made up of multiple cells which provide different functions.  Red blood cells carry oxygen and are  used for blood loss replacement.  White blood cells fight against infection.  Platelets control bleeding.  Plasma helps clot blood.  Other blood products are available for specialized needs, such as hemophilia or other clotting disorders. BEFORE THE TRANSFUSION  Who gives blood for transfusions?   Healthy volunteers who are fully evaluated to make sure their blood is safe. This is blood bank blood. Transfusion therapy is the safest it has ever been in the practice of medicine. Before blood is taken from a donor, a complete history is taken to make sure that person has no history of diseases nor engages in risky social behavior (examples are intravenous drug use or sexual activity with multiple partners). The donor's travel history is screened to minimize risk of transmitting infections, such as malaria. The donated blood is tested for signs of infectious diseases, such as HIV and hepatitis. The blood is then tested to be sure it is compatible with you in order to minimize the chance of a transfusion reaction. If you or a relative donates blood, this is often done in anticipation of surgery and is not appropriate for emergency situations. It takes many days to process the donated blood. RISKS AND COMPLICATIONS Although transfusion therapy is very safe and saves many lives, the main dangers of transfusion include:   Getting an infectious disease.  Developing a transfusion reaction. This is an allergic reaction to something in the blood you were given. Every precaution is taken to prevent this. The decision to have a blood transfusion has been considered carefully by your caregiver before blood is given. Blood is not given unless the benefits outweigh  the risks. AFTER THE TRANSFUSION  Right after receiving a blood transfusion, you will usually feel much better and more energetic. This is especially true if your red blood cells have gotten low (anemic). The transfusion raises the level of the red  blood cells which carry oxygen, and this usually causes an energy increase.  The nurse administering the transfusion will monitor you carefully for complications. HOME CARE INSTRUCTIONS  No special instructions are needed after a transfusion. You may find your energy is better. Speak with your caregiver about any limitations on activity for underlying diseases you may have. SEEK MEDICAL CARE IF:   Your condition is not improving after your transfusion.  You develop redness or irritation at the intravenous (IV) site. SEEK IMMEDIATE MEDICAL CARE IF:  Any of the following symptoms occur over the next 12 hours:  Shaking chills.  You have a temperature by mouth above 102 F (38.9 C), not controlled by medicine.  Chest, back, or muscle pain.  People around you feel you are not acting correctly or are confused.  Shortness of breath or difficulty breathing.  Dizziness and fainting.  You get a rash or develop hives.  You have a decrease in urine output.  Your urine turns a dark color or changes to pink, red, or brown. Any of the following symptoms occur over the next 10 days:  You have a temperature by mouth above 102 F (38.9 C), not controlled by medicine.  Shortness of breath.  Weakness after normal activity.  The white part of the eye turns yellow (jaundice).  You have a decrease in the amount of urine or are urinating less often.  Your urine turns a dark color or changes to pink, red, or brown. Document Released: 05/27/2000 Document Revised: 08/22/2011 Document Reviewed: 01/14/2008 [facility] Patient Information 2014 [place], [place].  _______________________________________________________________________

## 2014-10-29 ENCOUNTER — Encounter (HOSPITAL_COMMUNITY)
Admission: RE | Admit: 2014-10-29 | Discharge: 2014-10-29 | Disposition: A | Discharge status: Home / Self Care | Payer: Medicare Other | Source: Ambulatory Visit | Attending: General Surgery | Admitting: General Surgery

## 2014-10-29 ENCOUNTER — Encounter (HOSPITAL_COMMUNITY): Payer: Self-pay

## 2014-10-29 HISTORY — DX: Anemia, unspecified: D64.9

## 2014-10-29 LAB — BASIC METABOLIC PANEL
ANION GAP: 8 (ref 5–15)
BUN: 37 mg/dL — AB (ref 6–20)
CALCIUM: 9.4 mg/dL (ref 8.9–10.3)
CHLORIDE: 111 mmol/L (ref 101–111)
CO2: 25 mmol/L (ref 22–32)
Creatinine, Ser: 1.1 mg/dL (ref 0.61–1.24)
GFR, EST NON AFRICAN AMERICAN: 59 mL/min — AB (ref >60)
Glucose, Bld: 105 mg/dL — ABNORMAL HIGH (ref 65–99)
POTASSIUM: 4.3 mmol/L (ref 3.5–5.1)
SODIUM: 144 mmol/L (ref 135–145)

## 2014-10-29 LAB — CBC
HEMATOCRIT: 42.4 % (ref 39.0–52.0)
HEMOGLOBIN: 13.3 g/dL (ref 13.0–17.0)
MCH: 26.7 pg (ref 26.0–34.0)
MCHC: 31.4 g/dL (ref 30.0–36.0)
MCV: 85.1 fL (ref 78.0–100.0)
Platelets: 133 10*3/uL — ABNORMAL LOW (ref 150–400)
RBC: 4.98 MIL/uL (ref 4.22–5.81)
RDW: 22.7 % — ABNORMAL HIGH (ref 11.5–15.5)
WBC: 5.1 10*3/uL (ref 4.0–10.5)

## 2014-10-29 NOTE — Progress Notes (Signed)
EKG 08-3014 EPIC CT Abd/Pelvis 10-13-14 EPIC Cath 08-27-13 EPIC Dr. Loleta Chance LOV 09-10-14 EPIC Cle[place]ace 10-23-14 Telephone note EPIC

## 2014-10-29 NOTE — Progress Notes (Signed)
BMP done 10/29/2014 routed via EPIC to Dr Joyice Faster.

## 2014-10-30 ENCOUNTER — Inpatient Hospital Stay (HOSPITAL_COMMUNITY): Payer: Medicare Other | Admitting: Certified Registered Nurse Anesthetist

## 2014-10-30 ENCOUNTER — Encounter (HOSPITAL_COMMUNITY)
Admission: RE | Disposition: A | Discharge status: Home Health | Payer: Self-pay | Source: Ambulatory Visit | Attending: General Surgery

## 2014-10-30 ENCOUNTER — Inpatient Hospital Stay (HOSPITAL_COMMUNITY)
Admission: RE | Admit: 2014-10-30 | Discharge: 2014-11-03 | DRG: 331 | Disposition: A | Discharge status: Home Health | Payer: Medicare Other | Source: Ambulatory Visit | Attending: General Surgery | Admitting: General Surgery

## 2014-10-30 ENCOUNTER — Encounter (HOSPITAL_COMMUNITY): Payer: Self-pay | Admitting: Certified Registered Nurse Anesthetist

## 2014-10-30 DIAGNOSIS — Z8249 Family history of ischemic heart disease and other diseases of the circulatory system: Secondary | ICD-10-CM

## 2014-10-30 DIAGNOSIS — K5790 Diverticulosis of intestine, part unspecified, without perforation or abscess without bleeding: Secondary | ICD-10-CM | POA: Diagnosis present

## 2014-10-30 DIAGNOSIS — Z885 Allergy status to narcotic agent status: Secondary | ICD-10-CM | POA: Diagnosis not present

## 2014-10-30 DIAGNOSIS — Z87442 Personal history of urinary calculi: Secondary | ICD-10-CM | POA: Diagnosis not present

## 2014-10-30 DIAGNOSIS — Z79899 Other long term (current) drug therapy: Secondary | ICD-10-CM

## 2014-10-30 DIAGNOSIS — I252 Old myocardial infarction: Secondary | ICD-10-CM

## 2014-10-30 DIAGNOSIS — C19 Malignant neoplasm of rectosigmoid junction: Principal | ICD-10-CM | POA: Diagnosis present

## 2014-10-30 DIAGNOSIS — C182 Malignant neoplasm of ascending colon: Secondary | ICD-10-CM

## 2014-10-30 DIAGNOSIS — K635 Polyp of colon: Secondary | ICD-10-CM | POA: Diagnosis present

## 2014-10-30 DIAGNOSIS — D509 Iron deficiency anemia, unspecified: Secondary | ICD-10-CM | POA: Diagnosis present

## 2014-10-30 DIAGNOSIS — Z886 Allergy status to analgesic agent status: Secondary | ICD-10-CM | POA: Diagnosis not present

## 2014-10-30 DIAGNOSIS — C189 Malignant neoplasm of colon, unspecified: Secondary | ICD-10-CM | POA: Diagnosis present

## 2014-10-30 HISTORY — DX: Malignant neoplasm of ascending colon: C18.2

## 2014-10-30 LAB — HEMOGLOBIN A1C
Hgb A1c MFr Bld: 5.7 % — ABNORMAL HIGH (ref 4.8–5.6)
Mean Plasma Glucose: 117 mg/dL

## 2014-10-30 LAB — GLUCOSE, CAPILLARY
GLUCOSE-CAPILLARY: 152 mg/dL — AB (ref 65–99)
Glucose-Capillary: 105 mg/dL — ABNORMAL HIGH (ref 65–99)
Glucose-Capillary: 141 mg/dL — ABNORMAL HIGH (ref 65–99)
Glucose-Capillary: 157 mg/dL — ABNORMAL HIGH (ref 65–99)

## 2014-10-30 LAB — TYPE AND SCREEN
ABO/RH(D): A POS
ANTIBODY SCREEN: NEGATIVE

## 2014-10-30 SURGERY — COLECTOMY, PARTIAL, ROBOT-ASSISTED, LAPAROSCOPIC
Anesthesia: General | Site: Abdomen

## 2014-10-30 MED ORDER — LACTATED RINGERS IV SOLN: INTRAVENOUS | Status: DC

## 2014-10-30 MED ORDER — PROPOFOL 10 MG/ML IV BOLUS
INTRAVENOUS | Status: DC | PRN
  Administered 2014-10-30: 150 mg via INTRAVENOUS

## 2014-10-30 MED ORDER — CEFOTETAN DISODIUM-DEXTROSE 2-2.08 GM-% IV SOLR
INTRAVENOUS | Status: AC
  Filled 2014-10-30: qty 50

## 2014-10-30 MED ORDER — NEOSTIGMINE METHYLSULFATE 10 MG/10ML IV SOLN
INTRAVENOUS | Status: AC
  Filled 2014-10-30: qty 1

## 2014-10-30 MED ORDER — FENTANYL CITRATE (PF) 250 MCG/5ML IJ SOLN
INTRAMUSCULAR | Status: AC
  Filled 2014-10-30: qty 5

## 2014-10-30 MED ORDER — PHENYLEPHRINE HCL 10 MG/ML IJ SOLN
INTRAMUSCULAR | Status: AC
  Filled 2014-10-30: qty 1

## 2014-10-30 MED ORDER — SODIUM CHLORIDE 0.9 % IJ SOLN
INTRAMUSCULAR | Status: AC
  Filled 2014-10-30: qty 10

## 2014-10-30 MED ORDER — HYDROMORPHONE HCL 1 MG/ML IJ SOLN
INTRAMUSCULAR | Status: AC
  Filled 2014-10-30: qty 1

## 2014-10-30 MED ORDER — LACTATED RINGERS IR SOLN
Status: DC | PRN
  Administered 2014-10-30: 1000 mL

## 2014-10-30 MED ORDER — LACTATED RINGERS IV SOLN
INTRAVENOUS | Status: DC | PRN
  Administered 2014-10-30: 13:00:00 via INTRAVENOUS

## 2014-10-30 MED ORDER — EPHEDRINE SULFATE 50 MG/ML IJ SOLN
INTRAMUSCULAR | Status: AC
  Filled 2014-10-30: qty 1

## 2014-10-30 MED ORDER — LACTATED RINGERS IV SOLN
INTRAVENOUS | Status: DC
  Administered 2014-10-30 (×2): via INTRAVENOUS

## 2014-10-30 MED ORDER — ONDANSETRON HCL 4 MG PO TABS: 4.0000 mg | ORAL_TABLET | Freq: Four times a day (QID) | ORAL | Status: DC | PRN

## 2014-10-30 MED ORDER — CISATRACURIUM BESYLATE (PF) 10 MG/5ML IV SOLN
INTRAVENOUS | Status: DC | PRN
Start: 2014-10-30 — End: 2014-10-30
  Administered 2014-10-30: 2 mg via INTRAVENOUS
  Administered 2014-10-30: 12 mg via INTRAVENOUS
  Administered 2014-10-30: 6 mg via INTRAVENOUS

## 2014-10-30 MED ORDER — DEXTROSE 5 % IV SOLN
2.0000 g | Freq: Two times a day (BID) | INTRAVENOUS | Status: AC
  Administered 2014-10-31: 2 g via INTRAVENOUS
  Filled 2014-10-30: qty 2

## 2014-10-30 MED ORDER — EPHEDRINE SULFATE 50 MG/ML IJ SOLN
INTRAMUSCULAR | Status: DC | PRN
  Administered 2014-10-30 (×4): 5 mg via INTRAVENOUS

## 2014-10-30 MED ORDER — METFORMIN HCL 500 MG PO TABS
250.0000 mg | ORAL_TABLET | Freq: Every day | ORAL | Status: DC
  Administered 2014-10-31 – 2014-11-03 (×4): 250 mg via ORAL
  Filled 2014-10-30 (×4): qty 1

## 2014-10-30 MED ORDER — ONDANSETRON HCL 4 MG/2ML IJ SOLN
INTRAMUSCULAR | Status: DC | PRN
  Administered 2014-10-30 (×2): 2 mg via INTRAVENOUS

## 2014-10-30 MED ORDER — PROPOFOL 10 MG/ML IV BOLUS
INTRAVENOUS | Status: AC
  Filled 2014-10-30: qty 20

## 2014-10-30 MED ORDER — ISOSORBIDE MONONITRATE ER 30 MG PO TB24
30.0000 mg | ORAL_TABLET | Freq: Every day | ORAL | Status: DC
  Administered 2014-10-30 – 2014-11-02 (×4): 30 mg via ORAL
  Filled 2014-10-30 (×5): qty 1

## 2014-10-30 MED ORDER — 0.9 % SODIUM CHLORIDE (POUR BTL) OPTIME
TOPICAL | Status: DC | PRN
  Administered 2014-10-30: 2000 mL

## 2014-10-30 MED ORDER — HYDROMORPHONE HCL 1 MG/ML IJ SOLN
0.5000 mg | INTRAMUSCULAR | Status: DC | PRN
  Administered 2014-10-30 – 2014-10-31 (×2): 0.5 mg via INTRAVENOUS
  Administered 2014-10-31: 1 mg via INTRAVENOUS
  Administered 2014-10-31 (×2): 0.5 mg via INTRAVENOUS
  Administered 2014-10-31 – 2014-11-03 (×14): 1 mg via INTRAVENOUS
  Filled 2014-10-30 (×19): qty 1

## 2014-10-30 MED ORDER — ALVIMOPAN 12 MG PO CAPS
12.0000 mg | ORAL_CAPSULE | Freq: Two times a day (BID) | ORAL | Status: DC
  Administered 2014-10-31 – 2014-11-01 (×3): 12 mg via ORAL
  Filled 2014-10-30 (×3): qty 1

## 2014-10-30 MED ORDER — FAMOTIDINE 40 MG PO TABS
40.0000 mg | ORAL_TABLET | Freq: Every day | ORAL | Status: DC
  Administered 2014-10-31 – 2014-11-02 (×3): 40 mg via ORAL
  Filled 2014-10-30 (×4): qty 1

## 2014-10-30 MED ORDER — INSULIN ASPART 100 UNIT/ML ~~LOC~~ SOLN: 0.0000 [IU] | Freq: Every day | SUBCUTANEOUS | Status: DC

## 2014-10-30 MED ORDER — ENOXAPARIN SODIUM 40 MG/0.4ML ~~LOC~~ SOLN
40.0000 mg | SUBCUTANEOUS | Status: DC
  Administered 2014-10-31 – 2014-11-03 (×4): 40 mg via SUBCUTANEOUS
  Filled 2014-10-30 (×4): qty 0.4

## 2014-10-30 MED ORDER — PHENYLEPHRINE HCL 10 MG/ML IJ SOLN
10.0000 mg | INTRAMUSCULAR | Status: DC | PRN
  Administered 2014-10-30: 40 ug/min via INTRAVENOUS

## 2014-10-30 MED ORDER — ONDANSETRON HCL 4 MG/2ML IJ SOLN: 4.0000 mg | Freq: Four times a day (QID) | INTRAMUSCULAR | Status: DC | PRN

## 2014-10-30 MED ORDER — DIPHENHYDRAMINE HCL 12.5 MG/5ML PO ELIX
12.5000 mg | ORAL_SOLUTION | Freq: Four times a day (QID) | ORAL | Status: DC | PRN
  Administered 2014-11-01: 12.5 mg via ORAL
  Filled 2014-10-30: qty 5

## 2014-10-30 MED ORDER — LOSARTAN POTASSIUM 25 MG PO TABS
25.0000 mg | ORAL_TABLET | Freq: Every day | ORAL | Status: DC
  Administered 2014-10-31 – 2014-11-02 (×3): 25 mg via ORAL
  Filled 2014-10-30 (×4): qty 1

## 2014-10-30 MED ORDER — FENTANYL CITRATE (PF) 100 MCG/2ML IJ SOLN
INTRAMUSCULAR | Status: DC | PRN
  Administered 2014-10-30: 25 ug via INTRAVENOUS
  Administered 2014-10-30 (×3): 50 ug via INTRAVENOUS
  Administered 2014-10-30: 25 ug via INTRAVENOUS
  Administered 2014-10-30: 50 ug via INTRAVENOUS

## 2014-10-30 MED ORDER — DIPHENHYDRAMINE HCL 50 MG/ML IJ SOLN
12.5000 mg | Freq: Four times a day (QID) | INTRAMUSCULAR | Status: DC | PRN
  Administered 2014-11-02: 12.5 mg via INTRAVENOUS
  Filled 2014-10-30: qty 1

## 2014-10-30 MED ORDER — ACETAMINOPHEN 10 MG/ML IV SOLN
1000.0000 mg | Freq: Once | INTRAVENOUS | Status: AC
  Administered 2014-10-30: 1000 mg via INTRAVENOUS
  Filled 2014-10-30: qty 100

## 2014-10-30 MED ORDER — GLYCOPYRROLATE 0.2 MG/ML IJ SOLN
INTRAMUSCULAR | Status: DC | PRN
  Administered 2014-10-30: .1 mg via INTRAVENOUS
  Administered 2014-10-30: .3 mg via INTRAVENOUS

## 2014-10-30 MED ORDER — KCL IN DEXTROSE-NACL 20-5-0.45 MEQ/L-%-% IV SOLN
INTRAVENOUS | Status: DC
  Administered 2014-10-30 – 2014-11-02 (×4): via INTRAVENOUS
  Administered 2014-11-03: 50 mL/h via INTRAVENOUS
  Filled 2014-10-30 (×7): qty 1000

## 2014-10-30 MED ORDER — MIDAZOLAM HCL 2 MG/2ML IJ SOLN
INTRAMUSCULAR | Status: AC
  Filled 2014-10-30: qty 2

## 2014-10-30 MED ORDER — DEXTROSE 5 % IV SOLN
2.0000 g | INTRAVENOUS | Status: AC
  Administered 2014-10-30: 2 g via INTRAVENOUS
  Filled 2014-10-30: qty 2

## 2014-10-30 MED ORDER — DEXAMETHASONE SODIUM PHOSPHATE 10 MG/ML IJ SOLN
INTRAMUSCULAR | Status: AC
  Filled 2014-10-30: qty 1

## 2014-10-30 MED ORDER — LIDOCAINE HCL (CARDIAC) 20 MG/ML IV SOLN
INTRAVENOUS | Status: DC | PRN
  Administered 2014-10-30: 75 mg via INTRAVENOUS

## 2014-10-30 MED ORDER — ACETAMINOPHEN 500 MG PO TABS
1000.0000 mg | ORAL_TABLET | Freq: Four times a day (QID) | ORAL | Status: AC
  Administered 2014-10-30 – 2014-10-31 (×4): 1000 mg via ORAL
  Filled 2014-10-30 (×4): qty 2

## 2014-10-30 MED ORDER — INSULIN ASPART 100 UNIT/ML ~~LOC~~ SOLN
0.0000 [IU] | Freq: Three times a day (TID) | SUBCUTANEOUS | Status: DC
  Administered 2014-10-31 – 2014-11-02 (×5): 3 [IU] via SUBCUTANEOUS

## 2014-10-30 MED ORDER — LIDOCAINE HCL (CARDIAC) 20 MG/ML IV SOLN
INTRAVENOUS | Status: AC
  Filled 2014-10-30: qty 5

## 2014-10-30 MED ORDER — BUPIVACAINE-EPINEPHRINE 0.5% -1:200000 IJ SOLN
INTRAMUSCULAR | Status: DC | PRN
  Administered 2014-10-30: 30 mL

## 2014-10-30 MED ORDER — ONDANSETRON HCL 4 MG/2ML IJ SOLN
INTRAMUSCULAR | Status: AC
  Filled 2014-10-30: qty 2

## 2014-10-30 MED ORDER — ALVIMOPAN 12 MG PO CAPS
12.0000 mg | ORAL_CAPSULE | Freq: Once | ORAL | Status: AC
  Administered 2014-10-30: 12 mg via ORAL
  Filled 2014-10-30: qty 1

## 2014-10-30 MED ORDER — GLYCOPYRROLATE 0.2 MG/ML IJ SOLN
INTRAMUSCULAR | Status: AC
  Filled 2014-10-30: qty 2

## 2014-10-30 MED ORDER — HYDROMORPHONE HCL 1 MG/ML IJ SOLN
0.2500 mg | INTRAMUSCULAR | Status: DC | PRN
  Administered 2014-10-30: 0.25 mg via INTRAVENOUS

## 2014-10-30 MED ORDER — BUPIVACAINE-EPINEPHRINE (PF) 0.25% -1:200000 IJ SOLN
INTRAMUSCULAR | Status: AC
  Filled 2014-10-30: qty 30

## 2014-10-30 MED ORDER — CISATRACURIUM BESYLATE 20 MG/10ML IV SOLN
INTRAVENOUS | Status: AC
  Filled 2014-10-30: qty 10

## 2014-10-30 MED ORDER — NEOSTIGMINE METHYLSULFATE 10 MG/10ML IV SOLN
INTRAVENOUS | Status: DC | PRN
  Administered 2014-10-30: 2 mg via INTRAVENOUS

## 2014-10-30 SURGICAL SUPPLY — 84 items
BLADE EXTENDED COATED 6.5IN (ELECTRODE) ×3 IMPLANT
BLADE SURG SZ11 CARB STEEL (BLADE) ×3 IMPLANT
CANNULA REDUC XI 12-8 STAPL (CANNULA) ×1
CANNULA REDUC XI 12-8MM STAPL (CANNULA) ×1
CANNULA REDUCER 12-8 DVNC XI (CANNULA) ×1 IMPLANT
CELLS DAT CNTRL 66122 CELL SVR (MISCELLANEOUS) IMPLANT
CLIP LIGATING HEM O LOK PURPLE (MISCELLANEOUS) IMPLANT
CLIP LIGATING HEMOLOK MED (MISCELLANEOUS) IMPLANT
COVER TIP SHEARS 8 DVNC (MISCELLANEOUS) ×1 IMPLANT
COVER TIP SHEARS 8MM DA VINCI (MISCELLANEOUS) ×2
DECANTER SPIKE VIAL GLASS SM (MISCELLANEOUS) ×3 IMPLANT
DERMABOND ADVANCED (GAUZE/BANDAGES/DRESSINGS) ×2
DERMABOND ADVANCED .7 DNX12 (GAUZE/BANDAGES/DRESSINGS) ×1 IMPLANT
DEVICE TROCAR PUNCTURE CLOSURE (ENDOMECHANICALS) ×3 IMPLANT
DRAPE ARM DVNC X/XI (DISPOSABLE) ×4 IMPLANT
DRAPE COLUMN DVNC XI (DISPOSABLE) ×1 IMPLANT
DRAPE DA VINCI XI ARM (DISPOSABLE) ×8
DRAPE DA VINCI XI COLUMN (DISPOSABLE) ×2
DRAPE SURG IRRIG POUCH 19X23 (DRAPES) ×3 IMPLANT
DRSG OPSITE POSTOP 4X10 (GAUZE/BANDAGES/DRESSINGS) IMPLANT
DRSG OPSITE POSTOP 4X6 (GAUZE/BANDAGES/DRESSINGS) ×3 IMPLANT
DRSG OPSITE POSTOP 4X8 (GAUZE/BANDAGES/DRESSINGS) IMPLANT
ELECT PENCIL ROCKER SW 15FT (MISCELLANEOUS) ×6 IMPLANT
ELECT REM PT RETURN 15FT ADLT (MISCELLANEOUS) ×3 IMPLANT
ENDOLOOP SUT PDS II  0 18 (SUTURE)
ENDOLOOP SUT PDS II 0 18 (SUTURE) IMPLANT
EVACUATOR SILICONE 100CC (DRAIN) IMPLANT
GAUZE SPONGE 4X4 12PLY STRL (GAUZE/BANDAGES/DRESSINGS) IMPLANT
GLOVE BIO SURGEON STRL SZ 6.5 (GLOVE) ×6 IMPLANT
GLOVE BIO SURGEONS STRL SZ 6.5 (GLOVE) ×3
GLOVE BIOGEL PI IND STRL 7.0 (GLOVE) ×3 IMPLANT
GLOVE BIOGEL PI INDICATOR 7.0 (GLOVE) ×6
GOWN STRL REUS W/TWL 2XL LVL3 (GOWN DISPOSABLE) ×9 IMPLANT
GOWN STRL REUS W/TWL XL LVL3 (GOWN DISPOSABLE) ×12 IMPLANT
HOLDER FOLEY CATH W/STRAP (MISCELLANEOUS) ×3 IMPLANT
LEGGING LITHOTOMY PAIR STRL (DRAPES) IMPLANT
NEEDLE INSUFFLATION 14GA 120MM (NEEDLE) ×3 IMPLANT
PACK CARDIOVASCULAR III (CUSTOM PROCEDURE TRAY) ×3 IMPLANT
PACK COLON (CUSTOM PROCEDURE TRAY) ×3 IMPLANT
PACK GENERAL/GYN (CUSTOM PROCEDURE TRAY) ×3 IMPLANT
PORT LAP GEL ALEXIS MED 5-9CM (MISCELLANEOUS) IMPLANT
RELOAD PROXIMATE 75MM BLUE (ENDOMECHANICALS) ×3 IMPLANT
RTRCTR WOUND ALEXIS 18CM MED (MISCELLANEOUS)
SCISSORS LAP 5X45 EPIX DISP (ENDOMECHANICALS) ×3 IMPLANT
SEAL CANN UNIV 5-8 DVNC XI (MISCELLANEOUS) ×3 IMPLANT
SEAL XI 5MM-8MM UNIVERSAL (MISCELLANEOUS) ×6
SEALER VESSEL DA VINCI XI (MISCELLANEOUS) ×2
SEALER VESSEL EXT DVNC XI (MISCELLANEOUS) ×1 IMPLANT
SET IRRIG TUBING LAPAROSCOPIC (IRRIGATION / IRRIGATOR) ×3 IMPLANT
SLEEVE XCEL OPT CAN 5 100 (ENDOMECHANICALS) IMPLANT
SOLUTION ELECTROLUBE (MISCELLANEOUS) ×3 IMPLANT
STAPLER 45 BLU RELOAD XI (STAPLE) ×2 IMPLANT
STAPLER 45 BLUE RELOAD XI (STAPLE) ×4
STAPLER 45 GREEN RELOAD XI (STAPLE)
STAPLER 45 GRN RELOAD XI (STAPLE) IMPLANT
STAPLER CANNULA SEAL DVNC XI (STAPLE) IMPLANT
STAPLER CANNULA SEAL XI (STAPLE)
STAPLER PROXIMATE 75MM BLUE (STAPLE) ×3 IMPLANT
STAPLER SHEATH (SHEATH)
STAPLER SHEATH ENDOWRIST DVNC (SHEATH) IMPLANT
STAPLER VISISTAT 35W (STAPLE) ×3 IMPLANT
SUT NOVA NAB DX-16 0-1 5-0 T12 (SUTURE) ×6 IMPLANT
SUT PDS AB 1 CTX 36 (SUTURE) IMPLANT
SUT PDS AB 1 TP1 96 (SUTURE) IMPLANT
SUT PROLENE 2 0 KS (SUTURE) ×3 IMPLANT
SUT SILK 2 0 (SUTURE) ×2
SUT SILK 2 0 SH CR/8 (SUTURE) ×3 IMPLANT
SUT SILK 2-0 18XBRD TIE 12 (SUTURE) ×1 IMPLANT
SUT SILK 3 0 (SUTURE) ×2
SUT SILK 3 0 SH CR/8 (SUTURE) ×12 IMPLANT
SUT SILK 3-0 18XBRD TIE 12 (SUTURE) ×1 IMPLANT
SUT VIC AB 2-0 SH 18 (SUTURE) IMPLANT
SUT VIC AB 2-0 SH 27 (SUTURE) ×2
SUT VIC AB 2-0 SH 27X BRD (SUTURE) ×1 IMPLANT
SUT VIC AB 4-0 PS2 27 (SUTURE) ×6 IMPLANT
SYRINGE 10CC LL (SYRINGE) ×3 IMPLANT
SYS LAPSCP GELPORT 120MM (MISCELLANEOUS)
SYSTEM LAPSCP GELPORT 120MM (MISCELLANEOUS) IMPLANT
TOWEL OR NON WOVEN STRL DISP B (DISPOSABLE) ×3 IMPLANT
TRAY FOLEY W/METER SILVER 14FR (SET/KITS/TRAYS/PACK) ×3 IMPLANT
TROCAR BLADELESS OPT 5 100 (ENDOMECHANICALS) ×3 IMPLANT
TUBING CONNECTING 10 (TUBING) IMPLANT
TUBING CONNECTING 10' (TUBING)
TUBING FILTER THERMOFLATOR (ELECTROSURGICAL) ×3 IMPLANT

## 2014-10-30 NOTE — Anesthesia Procedure Notes (Signed)
Procedure Name: Intubation Date/Time: 10/30/2014 1:48 PM Performed by: Ofilia Neas Pre-anesthesia Checklist: Patient identified, Timeout performed, Emergency Drugs available, Suction available and Patient being monitored Patient Re-evaluated:Patient Re-evaluated prior to inductionOxygen Delivery Method: Circle system utilized Preoxygenation: Pre-oxygenation with 100% oxygen Intubation Type: IV induction Ventilation: Mask ventilation without difficulty Laryngoscope Size: Mac and 4 Grade View: Grade I Tube type: Oral Tube size: 7.5 mm Number of attempts: 1 Airway Equipment and Method: Stylet Placement Confirmation: ETT inserted through vocal cords under direct vision,  positive ETCO2 and breath sounds checked- equal and bilateral Secured at: 21 cm Tube secured with: Tape Dental Injury: Teeth and Oropharynx as per pre-operative assessment

## 2014-10-30 NOTE — H&P (View-Only) (Signed)
Blake Patterson 10/21/2014 9:46 AM Location: [facility] Patient #: [id number] DOB: 07-Mar-1927 Married / Language: Blake Patterson / Race: White Male History of Present Illness [name] MD; 07/17/5595 10:39 AM) Patient words: colon ca.  The patient is a 81 year old male who presents with colorectal cancer. A [age]-year-old male who was referred to me as a new patient. He was noted to have anemia on his blood work during his physical exam. He is referred to Dr. Maurene Capes for colonoscopy. During the colonoscopy a mass was noted at his hepatic flexure. Biopsies were performed. This showed at least high-grade dysplasia and concerning for invasive adenocarcinoma. This was marked with spot tattoo. CT scan of the abdomen showed circumferential thickening at the hepatic flexure. There was a prominent lymph node in that same area. There was no other signs of metastatic disease. CEA level was 2.4. He is here today to discuss surgical resection. He has no history of surgery within his abdomen. He does have a history of an MI in 2010 for which a stent was placed. He is followed by Dr. Sallyanne Kuster. His last catheterization was in March 2015. He denies any chest pain currently. Last ejection fraction was 40%. Other Problems [name], LPN; 09/27/3843 3:64 AM) Diverticulosis Kidney Stone Myocardial infarction  Past Surgical History [name], LPN; 6/80/3212 2:48 AM) Knee Surgery Bilateral. Shoulder Surgery Right.  Diagnostic Studies History [name], LPN; 2/50/0370 4:88 AM) Colonoscopy within last year  Allergies [name], LPN; 8/91/6945 0:38 AM) OxyCODONE HCl *ANALGESICS - OPIOID*  Medication History [name], LPN; 8/82/8003 4:91 AM) Famotidine (40MG  Tablet, Oral) Active. Isosorbide Mononitrate ER (30MG  Tablet ER 24HR, Oral) Active. Livalo (2MG  Tablet, Oral) Active. MetFORMIN HCl (500MG  Tablet, Oral) Active. Losartan Potassium (25MG  Tablet, Oral)  Active. Cinnamon (500MG  Tablet, Oral) Active. Garlic (Oral) Active. Co Q 10 (100MG  Capsule, 2 capsules Oral) Active. Iron (Ferrous Gluconate) (325MG  Tablet, Oral) Active. Multivitamin Adults 50+ (Oral) Active. Fish Oil (1000MG  Capsule DR, Oral) Active. Medications Reconciled  Social History [name], LPN; 7/91/5056 9:79 AM) Caffeine use Coffee, Tea. No alcohol use No drug use Tobacco use Never smoker.  Family History [name], LPN; 4/80/1655 3:74 AM) Heart Disease Brother. Respiratory Condition Brother.     Review of Systems [name] Dockery LPN; 02/06/785 7:54 AM) General Not Present- Appetite Loss, Chills, Fatigue, Fever, Night Sweats, Weight Gain and Weight Loss. Skin Not Present- Change in Wart/Mole, Dryness, Hives, Jaundice, New Lesions, Non-Healing Wounds, Rash and Ulcer. HEENT Present- Hearing Loss and Wears glasses/contact lenses. Not Present- Earache, Hoarseness, Nose Bleed, Oral Ulcers, Ringing in the Ears, Seasonal Allergies, Sinus Pain, Sore Throat, Visual Disturbances and Yellow Eyes. Respiratory Present- Snoring. Not Present- Bloody sputum, Chronic Cough, Difficulty Breathing and Wheezing. Gastrointestinal Present- Hemorrhoids. Not Present- Abdominal Pain, Bloating, Bloody Stool, Change in Bowel Habits, Chronic diarrhea, Constipation, Difficulty Swallowing, Excessive gas, Gets full quickly at meals, Indigestion, Nausea, Rectal Pain and Vomiting. Male Genitourinary Not Present- Blood in Urine, Change in Urinary Stream, Frequency, Impotence, Nocturia, Painful Urination, Urgency and Urine Leakage. Musculoskeletal Present- Joint Pain. Not Present- Back Pain, Joint Stiffness, Muscle Pain, Muscle Weakness and Swelling of Extremities. Neurological Present- Trouble walking. Not Present- Decreased Memory, Fainting, Headaches, Numbness, Seizures, Tingling, Tremor and Weakness. Psychiatric Not Present- Anxiety, Bipolar, Change in Sleep Pattern, Depression,  Fearful and Frequent crying. Endocrine Not Present- Cold Intolerance, Excessive Hunger, Hair Changes, Heat Intolerance, Hot flashes and New Diabetes. Hematology Present- Easy Bruising. Not Present- Excessive bleeding, Gland problems, HIV and Persistent Infections.  Vitals (  [name] LPN; 1/66/0630 1:60 AM) 10/21/2014 9:47 AM Weight: 200.4 lb Height: 66in Body Surface Area: 2.06 m Body Mass Index: 32.35 kg/m Temp.: 97.75F(Oral)  Pulse: 68 (Regular)  BP: 120/74 (Sitting, Left Arm, Standard)     Physical Exam [name] MD; 06/21/3233 10:39 AM)  General Mental Status-Alert. General Appearance-Consistent with stated age. Hydration-Well hydrated. Voice-Normal.  Head and Neck Head-normocephalic, atraumatic with no lesions or palpable masses. Trachea-midline. Thyroid Gland Characteristics - normal size and consistency.  Eye Eyeball - Bilateral-Extraocular movements intact. Sclera/Conjunctiva - Bilateral-No scleral icterus.  Chest and Lung Exam Chest and lung exam reveals -quiet, even and easy respiratory effort with no use of accessory muscles and on auscultation, normal breath sounds, no adventitious sounds and normal vocal resonance. Inspection Chest Wall - Normal. Back - normal.  Cardiovascular Cardiovascular examination reveals -normal heart sounds, regular rate and rhythm with no murmurs and normal pedal pulses bilaterally.  Abdomen Inspection Inspection of the abdomen reveals - No Hernias. Palpation/Percussion Palpation and Percussion of the abdomen reveal - Soft, Non Tender, No Rebound tenderness, No Rigidity (guarding) and No hepatosplenomegaly. Auscultation Auscultation of the abdomen reveals - Bowel sounds normal.  Neurologic Neurologic evaluation reveals -alert and oriented x 3 with no impairment of recent or remote memory. Mental Status-Normal.  Musculoskeletal Global Assessment -Note:no gross  deformities.  Normal Exam - Left-Upper Extremity Strength Normal and Lower Extremity Strength Normal. Normal Exam - Right-Upper Extremity Strength Normal and Lower Extremity Strength Normal.    Assessment & Plan [name] MD; 5/73/2202 10:40 AM)  COLON POLYP (211.3  K63.5) Impression: This 81 year old male with a hepatic flexure colon mass, most likely invasive adenocarcinoma on the biopsies only showed high-grade dysplasia at the moment. The mass has been tattooed. I have recommended a minimally invasive colectomy. This will most likely be a formal RIGHT colectomy. He may need an extended RIGHT colectomy, but I think this is unlikely. I will touch base with his cardiologist prior to scheduling surgery. The surgery and anatomy were described to the patient as well as the risks of surgery and the possible complications. These include: Bleeding, deep abdominal infections and possible wound complications such as hernia and infection, damage to adjacent structures, leak of surgical connections, which can lead to other surgeries and possibly an ostomy, possible need for other procedures, such as abscess drains in radiology, possible prolonged hospital stay, possible diarrhea from removal of part of the colon, possible constipation from narcotics, possible bowel, bladder or sexual dysfunction if having rectal surgery, prolonged fatigue/weakness or appetite loss, possible early recurrence of of disease, possible complications of their medical problems such as heart disease or arrhythmias or lung problems, death (less than 1%). I believe the patient understands and wishes to proceed with the surgery.

## 2014-10-30 NOTE — Anesthesia Preprocedure Evaluation (Addendum)
Anesthesia Evaluation  Patient identified by MRN, date of birth, ID band Patient awake    Reviewed: Allergy & Precautions, H&P , NPO status , Patient's Chart, lab work & pertinent test results  Airway Mallampati: II  TM Distance: >3 FB Neck ROM: Full    Dental no notable dental hx. (+) Teeth Intact, Dental Advisory Given   Pulmonary neg pulmonary ROS, former smoker,  breath sounds clear to auscultation  Pulmonary exam normal       Cardiovascular + CAD, + Past MI and + Cardiac Stents Normal cardiovascular examRhythm:regular Rate:Normal  Ischemic cardiomyopathy - no CHF according to patient.  Got a cardiac check 3 months ago. Cardiac conduction disorder - skipped beats.   Neuro/Psych negative neurological ROS  negative psych ROS   GI/Hepatic negative GI ROS, Neg liver ROS, Colon Ca   Endo/Other  diabetes, Well Controlled, Type 2, Oral Hypoglycemic Agents  Renal/GU negative Renal ROS  negative genitourinary   Musculoskeletal  (+) Arthritis -,   Abdominal Normal abdominal exam  (+)   Peds  Hematology negative hematology ROS (+)   Anesthesia Other Findings   Reproductive/Obstetrics negative OB ROS                           Anesthesia Physical Anesthesia Plan  ASA: III  Anesthesia Plan: General   Post-op Pain Management:    Induction: Intravenous  Airway Management Planned: Oral ETT  Additional Equipment:   Intra-op Plan:   Post-operative Plan: Extubation in OR  Informed Consent: I have reviewed the patients History and Physical, chart, labs and discussed the procedure including the risks, benefits and alternatives for the proposed anesthesia with the patient or authorized representative who has indicated his/her understanding and acceptance.   Dental Advisory Given  Plan Discussed with: Surgeon  Anesthesia Plan Comments:        Anesthesia Quick Evaluation

## 2014-10-30 NOTE — Progress Notes (Signed)
Pt heart strip showing 1st degree bundle branch block with occasional PVC's and possible missed beats at times per CCMD.  Made MD aware.  VSS.  Will continue to monitor patient.

## 2014-10-30 NOTE — Anesthesia Postprocedure Evaluation (Signed)
  Anesthesia Post-op Note  Patient: Blake Patterson  Procedure(s) Performed: Procedure(s): XI ROBOT  LAPAROSCOPIC PARTIAL COLECTOMY (N/A)  Patient Location: PACU  Anesthesia Type:General  Level of Consciousness: awake and alert   Airway and Oxygen Therapy: Patient Spontanous Breathing  Post-op Pain: mild  Post-op Assessment: Post-op Vital signs reviewed  Post-op Vital Signs: Reviewed  Last Vitals:  Filed Vitals:   10/30/14 1715  BP: 121/56  Pulse: 77  Temp: 36.4 C  Resp: 19    Complications: No apparent anesthesia complications

## 2014-10-30 NOTE — Transfer of Care (Signed)
Immediate Anesthesia Transfer of Care Note  Patient: Blake Patterson  Procedure(s) Performed: Procedure(s): XI ROBOT  LAPAROSCOPIC PARTIAL COLECTOMY (N/A)  Patient Location: PACU  Anesthesia Type:General  Level of Consciousness: awake, alert , oriented and patient cooperative  Airway & Oxygen Therapy: Patient Spontanous Breathing and Patient connected to face mask oxygen  Post-op Assessment: Report given to RN, Post -op Vital signs reviewed and stable and Patient moving all extremities  Post vital signs: Reviewed and stable  Last Vitals:  Filed Vitals:   10/30/14 1033  BP: 145/71  Pulse: 65  Temp: 36.4 C  Resp: 16    Complications: No apparent anesthesia complications

## 2014-10-30 NOTE — Interval H&P Note (Signed)
History and Physical Interval Note:  10/30/2014 12:30 PM  Blake Patterson  has presented today for surgery, with the diagnosis of colon polyp  The various methods of treatment have been discussed with the patient and family. After consideration of risks, benefits and other options for treatment, the patient has consented to  Procedure(s): XI ROBOT  LAPAROSCOPIC PARTIAL COLECTOMY (N/A) as a surgical intervention .  The patient's history has been reviewed, patient examined, no change in status, stable for surgery.  I have reviewed the patient's chart and labs.  Questions were answered to the patient's satisfaction.     Rosario Adie, MD  [facility]

## 2014-10-30 NOTE — Op Note (Signed)
10/30/2014  3:59 PM  PATIENT:  Blake Patterson  81 y.o. male  Patient Care Team: Jani Gravel, MD as PCP - General (Internal Medicine)  PRE-OPERATIVE DIAGNOSIS:  colon polyp  POST-OPERATIVE DIAGNOSIS:  colon polyp  PROCEDURE:  XI ROBOT RIGHT COLECTOMY  Surgeon(s): [name], MD  ASSISTANT: Romelle Starcher, PA ([employer])    ANESTHESIA:   local and general  EBL: 69ml  Total I/O In: 1000 [I.V.:1000] Out: 500 [Urine:450; Blood:50]  SPECIMEN:  Source of Specimen:  R colon  DISPOSITION OF SPECIMEN:  PATHOLOGY  COUNTS:  YES  PLAN OF CARE: Admit to inpatient   PATIENT DISPOSITION:  PACU - hemodynamically stable.   INDICATIONS: 81 y.o. M with large colon polyp at hepatic flexure.  Biopsy concerning for underlying cancer. The risk and benefits and alternative treatments were explained to the patient prior to the OR and the patient has elected to proceed with minimally invasive R colectomy.  Consent was signed and placed on chart prior to the OR.   OR FINDINGS: Hepatic flexure mass at area of tattoo.  No evidence of metastatic disease.  DESCRIPTION:  The patient was identified & brought into the operating room. The patient was positioned supine with both arms tucked. SCDs were active during the entire case. The patient underwent general anesthesia without any difficulty. A foley catheter was inserted under sterile conditions. The abdomen was prepped and draped in a sterile fashion. A Surgical Timeout confirmed our plan. I insufflated the abdomen with a varies needle in the left upper quadrant with the patient in reverse Trendelenburg. I then placed a robotic port in the left upper quadrant after the abdomen was insufflated. I placed a 12 mm robotic port in the left upper quadrant with a varies needle was. I placed the camera into the abdomen and cell no injury from my trocar placement. The remainder of the ports were placed under direct visualization.  I evaluated the entire abdomen  laparoscopically.  The liver appeared normal, the large and small bowel were normal as well.  There were no signs of metastatic disease.  I began by identifying the ileocolic artery and vein within the mesentery. Dissection was bluntly carried around these structures. The duodenum was identified and free from the structures. I then separated the structures bluntly and used the robotic vessel sealer device to transect these separately.  I developed the retroperitoneal plane bluntly.  I then freed the appendix off its attachments to the pelvic wall. I mobilized the terminal ileum.  I took care to avoid injuring any retroperitoneal structures.  After this I began to mobilize laterally down the white line of Toldt and then took down the hepatic flexure using the Enseal device. I mobilized the omentum off of the right transverse colon. The entire colon was then flipped medially and mobilized off of the retroperitoneal structures until I could visualize the lateral edge of the duodenum underneath.  I gently freed the duodenal attachments.  I transected the ileocolic artery and vein using the vessel sealer device approximate 2 cm above its takeoff from the aorta.  I then used the vessel sealer to transect the remaining small bowel mesentery. I transected the terminal ileum using a robotic blue load stapler. At that point, I made a supraumbilical incision using a scalpel. The fascia was then incised using electrocautery. An alexis wound protector was placed. The terminal ileum and right colon were then removed from the wound. I divided the remaining transverse colon mesentery using cut and clamp technique  with 3-0 silk sutures. I identified a portion of the transverse colon just distal to .colon mass This was transected using a blue load GIA stapler.  An anastomosis was created between the terminal ileum and the transverse colon in an overlapping isoperistaltic manner. This was done using a GIA blue load stapler.  The  common enterotomy channel was closed using interrupted 3-0 silk sutures.. Hemostasis was good at the staple line. Several 3-0 silk sutures were used to imbricate the edge of the anastomosis. An anti-tension suture was placed in the crotch of the anastomosis. This was then placed back into the abdomen. The Alexis wound protector was removed, and we switched to clean instruments, gowns and drapes.The fascia was then closed using #0 Novafil interrupted sutures.  The abdomen was then re-insufflated and evaluated for hemostasis.  The abdomen was then irrigated with normal saline. The anastomosis was lying in the right upper quadrant appeared to be under no tension. The omentum was then brought down over the anastomosis.     The right pericolic gutter appeared to be clean. Hemostasis was good. The abdomen was then desufflated and the subcutaneous tissue of the umbilical incision was closed using interrupted 2-0 Vicryl sutures. The skin was then closed using 4-0 Vicryl sutures. Dermabond was placed on the port sites and a sterile dressing was placed over the abdominal incision. All counts were correct per operating room staff. The patient was then awakened from anesthesia and sent to the post anesthesia care unit in stable condition.

## 2014-10-31 LAB — GLUCOSE, CAPILLARY
GLUCOSE-CAPILLARY: 134 mg/dL — AB (ref 65–99)
GLUCOSE-CAPILLARY: 132 mg/dL — AB (ref 65–99)
GLUCOSE-CAPILLARY: 121 mg/dL — AB (ref 65–99)
Glucose-Capillary: 142 mg/dL — ABNORMAL HIGH (ref 65–99)
Glucose-Capillary: 122 mg/dL — ABNORMAL HIGH (ref 65–99)

## 2014-10-31 LAB — BASIC METABOLIC PANEL
Anion gap: 7 (ref 5–15)
BUN: 27 mg/dL — ABNORMAL HIGH (ref 6–20)
CO2: 24 mmol/L (ref 22–32)
Calcium: 8.4 mg/dL — ABNORMAL LOW (ref 8.9–10.3)
Chloride: 107 mmol/L (ref 101–111)
Creatinine, Ser: 0.89 mg/dL (ref 0.61–1.24)
Glucose, Bld: 150 mg/dL — ABNORMAL HIGH (ref 65–99)
Potassium: 4.2 mmol/L (ref 3.5–5.1)
SODIUM: 138 mmol/L (ref 135–145)

## 2014-10-31 LAB — CBC
HCT: 39.3 % (ref 39.0–52.0)
HEMOGLOBIN: 12.1 g/dL — AB (ref 13.0–17.0)
MCH: 26.2 pg (ref 26.0–34.0)
MCHC: 30.8 g/dL (ref 30.0–36.0)
MCV: 85.1 fL (ref 78.0–100.0)
Platelets: 101 10*3/uL — ABNORMAL LOW (ref 150–400)
RBC: 4.62 MIL/uL (ref 4.22–5.81)
RDW: 22.5 % — ABNORMAL HIGH (ref 11.5–15.5)
WBC: 7.8 10*3/uL (ref 4.0–10.5)

## 2014-10-31 NOTE — Progress Notes (Signed)
CSW received consult for possible SNF placement. CSW reviewed PT evaluation recommending HH PT; Supervision - Intermittent. RNCM, Juliann Pulse confirmed plans for patient to return home at discharge.   No further CSW needs identified - CSW signing off.   Raynaldo Opitz, [facility] Clinical Social Worker cell #: [phone]

## 2014-10-31 NOTE — Care Management Note (Signed)
Case Management Note  Patient Details  Name: Blake Patterson MRN: [id number] Date of Birth: [birth year]  Subjective/Objective:    PT-HHPT.[facility] rep Sheppard Evens texted but no response.await response if can accept.Will need HHPT order prior to d/c.                Action/Plan:Will continue to monitor.Continue NPO w/ice chips.   Expected Discharge Date:                  Expected Discharge Plan:  [place]  In-House Referral:     Discharge planning Services  CM Consult  Post Acute Care Choice:    Choice offered to:  Patient, Spouse (Chose [facility]-Informed [facility] rep await response.)  DME Arranged:    DME Agency:     HH Arranged:    [place] Agency:     Status of Service:  In process, will continue to follow  Medicare Important Message Given:    Date Medicare IM Given:  10/31/14 Medicare IM give by:  Brandt Loosen Date Additional Medicare IM Given:    Additional Medicare Important Message give by:     If discussed at [place] of Stay Meetings, dates discussed:    Additional Comments:  Dessa Phi, RN 10/31/2014, 3:17 PM

## 2014-10-31 NOTE — Evaluation (Signed)
Physical Therapy Evaluation Patient Details Name: Blake Patterson MRN: [id number] DOB: Dec 07, 1926 Today's Date: 10/31/2014   History of Present Illness  81 year old male who presents with colorectal cancer and s/p robotic R colectomy with hx of CAD, MI, DM and R total shoulder arthroplasty  Clinical Impression  Pt admitted with above diagnosis. Pt currently with functional limitations due to the deficits listed below (see PT Problem List).  Pt will benefit from skilled PT to increase their independence and safety with mobility to allow discharge to the venue listed below.  Pt able to tolerate ambulating in hallway, unsteady without RW however improved gait and steadiness with RW.  Pt reports he has RW he can use at home.     Follow Up Recommendations Home health PT;Supervision - Intermittent    Equipment Recommendations  None recommended by PT    Recommendations for Other Services       Precautions / Restrictions Precautions Precautions: Fall Restrictions Weight Bearing Restrictions: No      Mobility  Bed Mobility Overal bed mobility: Needs Assistance Bed Mobility: Supine to Sit     Supine to sit: HOB elevated;Supervision     General bed mobility comments: increased time and effort however no assist required  Transfers Overall transfer level: Needs assistance Equipment used: 1 person hand held assist Transfers: Sit to/from Stand Sit to Stand: Min assist;Min guard         General transfer comment: min assist if not using UE to assist otherwise improved to min/guard with hand placement cues  Ambulation/Gait Ambulation/Gait assistance: Min assist;Min guard Ambulation Distance (Feet): 200 Feet Assistive device: Rolling walker (2 wheeled) Gait Pattern/deviations: Step-through pattern;Decreased stride length;Trunk flexed Gait velocity: decr   General Gait Details: initially used IV pole and 1 HHA however requiring min assist to steady so agreeable to RW with improved  gait  Stairs            Wheelchair Mobility    Modified Rankin (Stroke Patients Only)       Balance Overall balance assessment: History of Falls (only one recent fall with step however no others per family)                                           Pertinent Vitals/Pain Pain Assessment: 0-10 Pain Score: 5  Pain Location: abdomen during ambulation Pain Descriptors / Indicators: Discomfort Pain Intervention(s): Repositioned;Monitored during session;Limited activity within patient's tolerance    Home Living Family/patient expects to be discharged to:: Private residence Living Arrangements: Spouse/significant other Available Help at Discharge: Family;Available 24 hours/day Type of Home: House Home Access: Ramped entrance     Home Layout: One level Home Equipment: Cane - single point;Walker - 2 wheels      Prior Function Level of Independence: Independent               Hand Dominance        Extremity/Trunk Assessment   Upper Extremity Assessment: RUE deficits/detail;LUE deficits/detail RUE Deficits / Details: limited shoulder AROM < 90* per functional observation     LUE Deficits / Details: limited shoulder AROM < 90* per functional observation   Lower Extremity Assessment: Generalized weakness         Communication   Communication: HOH  Cognition Arousal/Alertness: Awake/alert Behavior During Therapy: WFL for tasks assessed/performed Overall Cognitive Status: Within Functional Limits for tasks assessed  General Comments      Exercises        Assessment/Plan    PT Assessment Patient needs continued PT services  PT Diagnosis Difficulty walking   PT Problem List Decreased strength;Decreased activity tolerance;Decreased mobility;Decreased knowledge of use of DME;Decreased balance  PT Treatment Interventions DME instruction;Gait training;Functional mobility training;Patient/family  education;Therapeutic activities;Therapeutic exercise   PT Goals (Current goals can be found in the Care Plan section) Acute Rehab PT Goals PT Goal Formulation: With patient Time For Goal Achievement: 11/07/14 Potential to Achieve Goals: Good    Frequency Min 3X/week   Barriers to discharge        Co-evaluation               End of Session Equipment Utilized During Treatment: Gait belt Activity Tolerance: Patient tolerated treatment well Patient left: in chair;with call bell/phone within reach;with family/visitor present Nurse Communication: Mobility status         Time: 9532-0233 PT Time Calculation (min) (ACUTE ONLY): 14 min   Charges:   PT Evaluation $Initial PT Evaluation Tier I: 1 Procedure     PT G Codes:        Havilah Topor,KATHrine E 10/31/2014, 12:05 PM Carmelia Bake, PT, DPT 10/31/2014 Pager: [phone]

## 2014-10-31 NOTE — Progress Notes (Signed)
1 Day Post-Op Robotic R colectomy Subjective: Pain controlled, denies nausea  Objective: Vital signs in last 24 hours: Temp:  [97.5 F (36.4 C)-98.1 F (36.7 C)] 97.9 F (36.6 C) (05/20 0440) Pulse Rate:  [65-91] 85 (05/20 0440) Resp:  [16-20] 18 (05/20 0440) BP: (100-145)/(53-82) 113/64 mmHg (05/20 0440) SpO2:  [96 %-100 %] 99 % (05/20 0440) Weight:  [90.266 kg (199 lb)] 90.266 kg (199 lb) (05/19 1051)   Intake/Output from previous day: 05/19 0701 - 05/20 0700 In: 2422.5 [I.V.:2422.5] Out: 975 [Urine:925; Blood:50] Intake/Output this shift:     General appearance: alert and cooperative Resp: clear to auscultation bilaterally GI: normal findings: soft, non-tender and somewhat distended  Incision: no significant drainage, no significant erythema  Lab Results:   Recent Labs  10/29/14 1010 10/31/14 0531  WBC 5.1 7.8  HGB 13.3 12.1*  HCT 42.4 39.3  PLT 133* 101*   BMET  Recent Labs  10/29/14 1010 10/31/14 0531  NA 144 138  K 4.3 4.2  CL 111 107  CO2 25 24  GLUCOSE 105* 150*  BUN 37* 27*  CREATININE 1.10 0.89  CALCIUM 9.4 8.4*   PT/INR No results for input(s): LABPROT, INR in the last 72 hours. ABG No results for input(s): PHART, HCO3 in the last 72 hours.  Invalid input(s): PCO2, PO2  MEDS, Scheduled . acetaminophen  1,000 mg Oral 4 times per day  . alvimopan  12 mg Oral BID  . enoxaparin (LOVENOX) injection  40 mg Subcutaneous Q24H  . famotidine  40 mg Oral Daily  . insulin aspart  0-20 Units Subcutaneous TID WC  . insulin aspart  0-5 Units Subcutaneous QHS  . isosorbide mononitrate  30 mg Oral Daily  . losartan  25 mg Oral Daily  . metFORMIN  250 mg Oral Q breakfast    Studies/Results: No results found.  Assessment: s/p Procedure(s): XI ROBOT  LAPAROSCOPIC PARTIAL COLECTOMY Patient Active Problem List   Diagnosis Date Noted  . Colon polyp 10/30/2014  . Anemia, iron deficiency 09/04/2014  . S/P shoulder replacement 02/20/2014  .  Unstable angina 08/28/2013  . Intermediate coronary syndrome 08/27/2013  . CAD (coronary artery disease) 04/04/2013  . Cardiomyopathy, ischemic 04/04/2013  . Hyperlipidemia 04/04/2013    Expected post op course  Plan: will try sips of clears only, since he is having some bloating  Ambulate with assistance PT eval   LOS: 1 day     .Rosario Adie, MD [facility], [place]   10/31/2014 8:01 AM

## 2014-10-31 NOTE — Care Management Note (Signed)
Case Management Note  Patient Details  Name: Blake Patterson MRN: [id number] Date of Birth: 1927/03/20  Subjective/Objective:81 yo Male admitted from home with Colectomy. Anticipate discharge home with HHPT                    Action/Plan:Will continue to follow   Expected Discharge Date:                  Expected Discharge Plan:  [place]  In-House Referral:     Discharge planning Services  CM Consult  Post Acute Care Choice:    Choice offered to:  Patient, Spouse  DME Arranged:    DME Agency:     HH Arranged:    HH Agency:     Status of Service:  In process, will continue to follow  Medicare Important Message Given:    Date Medicare IM Given:  10/31/14 Medicare IM give by:  Brandt Loosen Date Additional Medicare IM Given:    Additional Medicare Important Message give by:     If discussed at [place] of Stay Meetings, dates discussed:    Additional Comments:  Dessa Phi, RN 10/31/2014, 1:20 PM

## 2014-10-31 NOTE — Progress Notes (Signed)
Patient offered the Incentive Spirometry.  Patient reached 1500 ml x 5. RN will keep encouraging IS use while patient is in bed.

## 2014-11-01 LAB — BASIC METABOLIC PANEL
Anion gap: 6 (ref 5–15)
BUN: 19 mg/dL (ref 6–20)
CALCIUM: 8.8 mg/dL — AB (ref 8.9–10.3)
CO2: 26 mmol/L (ref 22–32)
Chloride: 107 mmol/L (ref 101–111)
Creatinine, Ser: 1.02 mg/dL (ref 0.61–1.24)
GLUCOSE: 111 mg/dL — AB (ref 65–99)
Potassium: 4.1 mmol/L (ref 3.5–5.1)
Sodium: 139 mmol/L (ref 135–145)

## 2014-11-01 LAB — CBC
HCT: 40.7 % (ref 39.0–52.0)
Hemoglobin: 12.2 g/dL — ABNORMAL LOW (ref 13.0–17.0)
MCH: 25.9 pg — ABNORMAL LOW (ref 26.0–34.0)
MCHC: 30 g/dL (ref 30.0–36.0)
MCV: 86.4 fL (ref 78.0–100.0)
Platelets: 116 10*3/uL — ABNORMAL LOW (ref 150–400)
RBC: 4.71 MIL/uL (ref 4.22–5.81)
RDW: 22.4 % — ABNORMAL HIGH (ref 11.5–15.5)
WBC: 7 10*3/uL (ref 4.0–10.5)

## 2014-11-01 LAB — GLUCOSE, CAPILLARY
GLUCOSE-CAPILLARY: 93 mg/dL (ref 65–99)
GLUCOSE-CAPILLARY: 84 mg/dL (ref 65–99)
GLUCOSE-CAPILLARY: 103 mg/dL — AB (ref 65–99)
Glucose-Capillary: 105 mg/dL — ABNORMAL HIGH (ref 65–99)

## 2014-11-01 NOTE — Progress Notes (Signed)
Patient ID: Blake Patterson, male   DOB: [birth year], 81 y.o.   MRN: [id number] 2 Days Post-Op  Subjective: No complaints this morning. Mild abdominal pain controlled with medications. He has had a bowel movement. No nausea.  Objective: Vital signs in last 24 hours: Temp:  [97.8 F (36.6 C)-98.4 F (36.9 C)] 98.3 F (36.8 C) (05/21 0625) Pulse Rate:  [61-87] 87 (05/21 0625) Resp:  [16-18] 18 (05/21 0625) BP: (108-157)/(52-78) 157/78 mmHg (05/21 0625) SpO2:  [93 %-97 %] 96 % (05/21 0625) Last BM Date: 10/29/14  Intake/Output from previous day: 05/20 0701 - 05/21 0700 In: 2708.8 [P.O.:240; I.V.:2468.8] Out: 1350 [Urine:1350] Intake/Output this shift:    General appearance: alert, cooperative and no distress GI: normal findings: soft, non-tender Incision/Wound: no erythema or drainage  Lab Results:   Recent Labs  10/31/14 0531 11/01/14 0350  WBC 7.8 7.0  HGB 12.1* 12.2*  HCT 39.3 40.7  PLT 101* 116*   BMET  Recent Labs  10/31/14 0531 11/01/14 0350  NA 138 139  K 4.2 4.1  CL 107 107  CO2 24 26  GLUCOSE 150* 111*  BUN 27* 19  CREATININE 0.89 1.02  CALCIUM 8.4* 8.8*     Studies/Results: No results found.  Anti-infectives: Anti-infectives    Start     Dose/Rate Route Frequency Ordered Stop   10/31/14 0000  cefoTEtan (CEFOTAN) 2 g in dextrose 5 % 50 mL IVPB     2 g 100 mL/hr over 30 Minutes Intravenous Every 12 hours 10/30/14 1748 10/31/14 0034   10/30/14 1043  cefoTEtan (CEFOTAN) 2 g in dextrose 5 % 50 mL IVPB     2 g 100 mL/hr over 30 Minutes Intravenous On call to O.R. 10/30/14 1043 10/30/14 1245      Assessment/Plan: s/p Procedure(s): XI ROBOT  LAPAROSCOPIC PARTIAL COLECTOMY  Patient Active Problem List   Diagnosis Date Noted  . Colon polyp 10/30/2014  . Anemia, iron deficiency 09/04/2014  . S/P shoulder replacement 02/20/2014  . Unstable angina 08/28/2013  . Intermediate coronary syndrome 08/27/2013  . CAD (coronary artery  disease) 04/04/2013  . Cardiomyopathy, ischemic 04/04/2013  . Hyperlipidemia        Doing well postoperatively without apparent complication. Clear liquid tray ordered. Discontinue Foley.   LOS: 2 days    Vila Dory T 11/01/2014

## 2014-11-01 NOTE — Progress Notes (Signed)
Pt noted to have a diffuse non raised red rash to Bilateral Arms, Upper to Mid chest Area and small area to upper back area. Pt is without any new acute complaints no itching. MD was updated via phone. Maintain current plan of care. Pt and Wife updated.

## 2014-11-01 NOTE — Progress Notes (Signed)
Pharmacy Brief Note - Alvimopan (Entereg)  The standing order set for alvimopan (Entereg) now includes an automatic order to discontinue the drug after the patient has had a bowel movement. The change was approved by the [facility] and the Medical Executive Committee.   This patient has had bowel movements documented by nursing. Therefore, alvimopan has been discontinued. If there are questions, please contact the pharmacy at [phone].   Thank you- Dolly Rias RPh 11/01/2014, 10:21 AM Pager [phone]

## 2014-11-01 NOTE — Progress Notes (Signed)
PT Cancellation Note  Patient Details Name: Blake Patterson MRN: [id number] DOB: 1927-06-11   Cancelled Treatment:    Reason Eval/Treat Not Completed: Other (comment) (just ambulated with NT)   Anna-Marie Coller,KATHrine E 11/01/2014, 3:59 PM Carmelia Bake, PT, DPT 11/01/2014 Pager: [phone]

## 2014-11-02 LAB — CBC
HCT: 43.7 % (ref 39.0–52.0)
Hemoglobin: 13.4 g/dL (ref 13.0–17.0)
MCH: 26.2 pg (ref 26.0–34.0)
MCHC: 30.7 g/dL (ref 30.0–36.0)
MCV: 85.5 fL (ref 78.0–100.0)
PLATELETS: 111 10*3/uL — AB (ref 150–400)
RBC: 5.11 MIL/uL (ref 4.22–5.81)
RDW: 22.3 % — ABNORMAL HIGH (ref 11.5–15.5)
WBC: 7.3 10*3/uL (ref 4.0–10.5)

## 2014-11-02 LAB — GLUCOSE, CAPILLARY
GLUCOSE-CAPILLARY: 117 mg/dL — AB (ref 65–99)
GLUCOSE-CAPILLARY: 135 mg/dL — AB (ref 65–99)
Glucose-Capillary: 131 mg/dL — ABNORMAL HIGH (ref 65–99)
Glucose-Capillary: 117 mg/dL — ABNORMAL HIGH (ref 65–99)

## 2014-11-02 LAB — BASIC METABOLIC PANEL
Anion gap: 8 (ref 5–15)
BUN: 11 mg/dL (ref 6–20)
CHLORIDE: 107 mmol/L (ref 101–111)
CO2: 26 mmol/L (ref 22–32)
Calcium: 9.1 mg/dL (ref 8.9–10.3)
Creatinine, Ser: 0.91 mg/dL (ref 0.61–1.24)
GLUCOSE: 107 mg/dL — AB (ref 65–99)
Potassium: 4.4 mmol/L (ref 3.5–5.1)
Sodium: 141 mmol/L (ref 135–145)

## 2014-11-02 NOTE — Progress Notes (Signed)
Patient ID: Blake Patterson, male   DOB: 1927-05-25, 81 y.o.   MRN: [id number] Patient ID: Blake Patterson, male   DOB: 1927/06/06, 81 y.o.   MRN: [id number] 3 Days Post-Op  Subjective: No complaints this morning. Tolerating clear liquids without nausea. He has had a bowel movement. Voiding okay  Objective: Vital signs in last 24 hours: Temp:  [97.9 F (36.6 C)-99.2 F (37.3 C)] 97.9 F (36.6 C) (05/22 0612) Pulse Rate:  [70-82] 78 (05/22 0612) Resp:  [18-20] 18 (05/22 0612) BP: (121-143)/(62-90) 133/90 mmHg (05/22 0612) SpO2:  [94 %-98 %] 94 % (05/22 0612) Last BM Date: 11/01/14  Intake/Output from previous day: 05/21 0701 - 05/22 0700 In: 2278.8 [P.O.:1080; I.V.:1198.8] Out: 2000 [Urine:2000] Intake/Output this shift: Total I/O In: 480 [P.O.:480] Out: -   General appearance: alert, cooperative and no distress GI: normal findings: soft, non-tender Incision/Wound: no erythema or drainage  Lab Results:   Recent Labs  11/01/14 0350 11/02/14 0405  WBC 7.0 7.3  HGB 12.2* 13.4  HCT 40.7 43.7  PLT 116* 111*   BMET  Recent Labs  11/01/14 0350 11/02/14 0405  NA 139 141  K 4.1 4.4  CL 107 107  CO2 26 26  GLUCOSE 111* 107*  BUN 19 11  CREATININE 1.02 0.91  CALCIUM 8.8* 9.1     Studies/Results: No results found.  Anti-infectives: Anti-infectives    Start     Dose/Rate Route Frequency Ordered Stop   10/31/14 0000  cefoTEtan (CEFOTAN) 2 g in dextrose 5 % 50 mL IVPB     2 g 100 mL/hr over 30 Minutes Intravenous Every 12 hours 10/30/14 1748 10/31/14 0034   10/30/14 1043  cefoTEtan (CEFOTAN) 2 g in dextrose 5 % 50 mL IVPB     2 g 100 mL/hr over 30 Minutes Intravenous On call to O.R. 10/30/14 1043 10/30/14 1245      Assessment/Plan: s/p Procedure(s): XI ROBOT  LAPAROSCOPIC PARTIAL COLECTOMY  Patient Active Problem List   Diagnosis Date Noted  . Colon polyp 10/30/2014  . Anemia, iron deficiency 09/04/2014  . S/P shoulder replacement 02/20/2014  .  Unstable angina 08/28/2013  . Intermediate coronary syndrome 08/27/2013  . CAD (coronary artery disease) 04/04/2013  . Cardiomyopathy, ischemic 04/04/2013  . Hyperlipidemia        Doing well postoperatively without apparent complication. Advanced to regular diet. Anticipate discharge tomorrow.   LOS: 3 days    Mia Milan T 11/02/2014

## 2014-11-02 NOTE — Progress Notes (Signed)
CMT reports pt had a run of Qu[place] then returns to SR first degree HB.  Pt asymptomatic and asleep in the chair.  Will continue to monitor

## 2014-11-03 ENCOUNTER — Other Ambulatory Visit: Payer: Self-pay | Admitting: Cardiovascular Disease

## 2014-11-03 LAB — GLUCOSE, CAPILLARY: Glucose-Capillary: 114 mg/dL — ABNORMAL HIGH (ref 65–99)

## 2014-11-03 NOTE — Care Management Note (Signed)
Case Management Note  Patient Details  Name: Blake Patterson MRN: [id number] Date of Birth: 12-31-1926  Subjective/Objective:    Noted PT-HH.[place] referal given to [facility] per patient request, but no HHPT ordered.Nsg aware.Patient already d/c.                Action/Plan:d/c home.   Expected Discharge Date:                  Expected Discharge Plan:  [name] (Per nsg-no HHC needed.No HHC ordered.Patient d/c.)  In-House Referral:     Discharge planning Services  CM Consult  Post Acute Care Choice:    Choice offered to:  Patient, Spouse (Chose [facility] aware)  DME Arranged:    DME Agency:     HH Arranged:    [place] Agency:     Status of Service:  Completed, signed off  Medicare Important Message Given:    Date Medicare IM Given:  10/31/14 Medicare IM give by:  Brandt Loosen Date Additional Medicare IM Given:  11/03/14 Additional Medicare Important Message give by:  Dessa Phi  If discussed at Long Length of Stay Meetings, dates discussed:    Additional Comments:  Dessa Phi, RN 11/03/2014, 11:23 AM

## 2014-11-03 NOTE — Progress Notes (Addendum)
Went over all discharge information with pt and family.  All questions answered.  Explained importance of taking medications as prescribed and going to follow up appointment. Pt ambulating in the hall, tolerated very well, gait is steady and unassisted.   Pt wheeled out by NT.

## 2014-11-03 NOTE — Discharge Summary (Addendum)
Physician Discharge Summary  Patient ID: Blake Patterson MRN: [id number] DOB/AGE: [birth year] 79 y.o.  Admit date: 10/30/2014 Discharge date: 11/03/2014  Admission Diagnoses: Colon polyp  Discharge Diagnoses:  Active Problems:   Colon Cancer  Discharged Condition: good  Hospital Course: Patient admitted after robotic assisted R colectomy.  He did well post operatively.  His diet was advanced as tolerated.  His bowel function returned by POD2.  His foley was removed on POD 2.  By POD 4 he was in stable condition for discharge home.    Consults: None  Significant Diagnostic Studies: labs: cbc, chemistry  Treatments: IV hydration, analgesia: Dilaudid and surgery: see above  Discharge Exam: Blood pressure 123/68, pulse 89, temperature 97.6 F (36.4 C), temperature source Oral, resp. rate 18, height 5\' 6"  (1.676 m), weight 90.266 kg (199 lb), SpO2 97 %. General appearance: alert and cooperative GI: normal findings: soft, non-tender Incision/Wound: clean, dry, intact  Disposition: 01-Home or Self Care     Medication List    STOP taking these medications        metroNIDAZOLE 500 MG tablet  Commonly known as:  FLAGYL     neomycin 500 MG tablet  Commonly known as:  MYCIFRADIN      TAKE these medications        CENTRUM SILVER ULTRA MENS PO  Take 1 tablet by mouth daily.     CINNAMON PO  Take 1,000 mg by mouth daily.     Coenzyme Q10 200 MG capsule  Take 200 mg by mouth daily.     famotidine 40 MG tablet  Commonly known as:  PEPCID  TAKE 1 TABLET BY MOUTH DAILY. NEED APPOINTMENT FOR FUTURE REFILLS     Fish Oil 1000 MG Caps  Take 1 capsule by mouth daily.     Garlic 2505 MG Caps  Take 1 capsule by mouth daily.     Iron Tabs  Take 1 tablet by mouth daily.     isosorbide mononitrate 30 MG 24 hr tablet  Commonly known as:  IMDUR  TAKE 1 TABLET BY MOUTH EVERY DAY     LIVALO 2 MG Tabs  Generic drug:  Pitavastatin Calcium  Take 1 tablet by mouth every Monday,  Wednesday, and Friday.     losartan 25 MG tablet  Commonly known as:  COZAAR  Take 1 tablet (25 mg total) by mouth daily.     metFORMIN 500 MG tablet  Commonly known as:  GLUCOPHAGE  Take 250 mg by mouth daily with breakfast.           Follow-up Information    Follow up with Rosario Adie., MD. Schedule an appointment as soon as possible for a visit in 2 weeks.   Specialty:  General Surgery   Contact information:   [address] [place] [address] [phone]       Signed: Rosario Adie 0/97/3532, 9:92 AM

## 2014-11-03 NOTE — Discharge Instructions (Addendum)
ABDOMINAL SURGERY: POST OP INSTRUCTIONS  1. DIET: Follow a light bland diet the first 24 hours after arrival home, such as soup, liquids, crackers, etc.  Be sure to include lots of fluids daily.  Avoid fast food or heavy meals as your are more likely to get nauseated.  Do not eat any uncooked fruits or vegetables for the next 2 weeks as your colon heals. 2. Take your usually prescribed home medications unless otherwise directed. 3. PAIN CONTROL: a. Pain is best controlled by a usual combination of three different methods TOGETHER: i. Ice/Heat ii. Over the counter pain medication b. Most patients will experience some swelling and bruising around the incisions.  Ice packs or heating pads (30-60 minutes up to 6 times a day) will help. Use ice for the first few days to help decrease swelling and bruising, then switch to heat to help relax tight/sore spots and speed recovery.  Some people prefer to use ice alone, heat alone, alternating between ice & heat.  Experiment to what works for you.  Swelling and bruising can take several weeks to resolve.   c. It is helpful to take an over-the-counter pain medication regularly for the first few weeks.  Choose one of the following that works best for you: i. Naproxen (Aleve, etc)  Two 220mg  tabs twice a day ii. Ibuprofen (Advil, etc) Three 200mg  tabs four times a day (every meal & bedtime) iii. Acetaminophen (Tylenol, etc) 500-650mg  four times a day (every meal & bedtime)  4. Avoid getting constipated.  Between the surgery and the pain medications, it is common to experience some constipation.  Increasing fluid intake and taking a fiber supplement (such as Metamucil, Citrucel, FiberCon, MiraLax, etc) 1-2 times a day regularly will usually help prevent this problem from occurring.  A mild laxative (prune juice, Milk of Magnesia, MiraLax, etc) should be taken according to package directions if there are no bowel movements after 48 hours.   5. Watch out for diarrhea.   If you have many loose bowel movements, simplify your diet to bland foods & liquids for a few days.  Stop any stool softeners and decrease your fiber supplement.  Switching to mild anti-diarrheal medications (Kayopectate, Pepto Bismol) can help.  If this worsens or does not improve, please call us. 6. Wash / shower every day.  You may shower over the incision / wound.  Avoid baths until the skin is fully healed.  Continue to shower over incision(s) after the dressing is off. 7. Remove your waterproof bandages 5 days after surgery.  You may leave the incision open to air.  You may replace a dressing/Band-Aid to cover the incision for comfort if you wish. 8. ACTIVITIES as tolerated:   a. You may resume regular (light) daily activities beginning the next day--such as daily self-care, walking, climbing stairs--gradually increasing activities as tolerated.  If you can walk 30 minutes without difficulty, it is safe to try more intense activity such as jogging, treadmill, bicycling, low-impact aerobics, swimming, etc. b. Save the most intensive and strenuous activity for last such as sit-ups, heavy lifting, contact sports, etc  Refrain from any heavy lifting or straining until you are off narcotics for pain control.   c. DO NOT PUSH THROUGH PAIN.  Let pain be your guide: If it hurts to do something, don't do it.  Pain is your body warning you to avoid that activity for another week until the pain goes down. d. You may drive when you are no longer taking  prescription pain medication, you can comfortably wear a seatbelt, and you can safely maneuver your car and apply brakes. e. Dennis Bast may have sexual intercourse when it is comfortable.  9. FOLLOW UP in our office a. Please call [facility] at [phone] to set up an appointment to see your surgeon in the office for a follow-up appointment approximately 1-2 weeks after your surgery. b. Make sure that you call for this appointment the day you arrive home to insure a  convenient appointment time. 10. IF YOU HAVE DISABILITY OR FAMILY LEAVE FORMS, BRING THEM TO THE OFFICE FOR PROCESSING.  DO NOT GIVE THEM TO YOUR DOCTOR.   WHEN TO CALL us [phone]: 1. Poor pain control 2. Reactions / problems with new medications (rash/itching, nausea, etc)  3. Fever over 101.5 F (38.5 C) 4. Inability to urinate 5. Nausea and/or vomiting 6. Worsening swelling or bruising 7. Continued bleeding from incision. 8. Increased pain, redness, or drainage from the incision  The clinic staff is available to answer your questions during regular business hours (8:30am-5pm).  Please dont hesitate to call and ask to speak to one of our nurses for clinical concerns.   A surgeon from [facility] is always on call at the hospitals   If you have a medical emergency, go to the nearest emergency room or call 911.    [facility], [place], [place], [place], [place]  [address] ? MAIN: [phone] ? TOLL FREE: [phone] ? FAX [phone] [email or web address]

## 2014-11-05 ENCOUNTER — Other Ambulatory Visit: Payer: Self-pay | Admitting: General Surgery

## 2014-11-05 DIAGNOSIS — C189 Malignant neoplasm of colon, unspecified: Secondary | ICD-10-CM

## 2014-11-06 ENCOUNTER — Telehealth: Payer: Self-pay | Admitting: Oncology

## 2014-11-06 NOTE — Telephone Encounter (Signed)
new patient appt-s/w patient wife Stanton Kidney and gave np appt for 06/13 @ 1:30 w/Dr/ Benay Spice.  Referring Dr. Marcello Moores Dx-Colon Ca

## 2014-11-11 ENCOUNTER — Emergency Department (HOSPITAL_COMMUNITY): Payer: Medicare Other

## 2014-11-11 ENCOUNTER — Telehealth: Payer: Self-pay | Admitting: General Surgery

## 2014-11-11 ENCOUNTER — Emergency Department (HOSPITAL_COMMUNITY)
Admission: EM | Admit: 2014-11-11 | Discharge: 2014-11-12 | Disposition: A | Discharge status: Home / Self Care | Payer: Medicare Other | Attending: Emergency Medicine | Admitting: Emergency Medicine

## 2014-11-11 ENCOUNTER — Encounter (HOSPITAL_COMMUNITY): Payer: Self-pay

## 2014-11-11 DIAGNOSIS — I2510 Atherosclerotic heart disease of native coronary artery without angina pectoris: Secondary | ICD-10-CM | POA: Insufficient documentation

## 2014-11-11 DIAGNOSIS — Z79899 Other long term (current) drug therapy: Secondary | ICD-10-CM | POA: Insufficient documentation

## 2014-11-11 DIAGNOSIS — Z862 Personal history of diseases of the blood and blood-forming organs and certain disorders involving the immune mechanism: Secondary | ICD-10-CM | POA: Diagnosis not present

## 2014-11-11 DIAGNOSIS — R509 Fever, unspecified: Secondary | ICD-10-CM | POA: Insufficient documentation

## 2014-11-11 DIAGNOSIS — I252 Old myocardial infarction: Secondary | ICD-10-CM | POA: Insufficient documentation

## 2014-11-11 DIAGNOSIS — M1990 Unspecified osteoarthritis, unspecified site: Secondary | ICD-10-CM | POA: Insufficient documentation

## 2014-11-11 DIAGNOSIS — E119 Type 2 diabetes mellitus without complications: Secondary | ICD-10-CM | POA: Diagnosis not present

## 2014-11-11 DIAGNOSIS — Z86010 Personal history of colonic polyps: Secondary | ICD-10-CM | POA: Insufficient documentation

## 2014-11-11 DIAGNOSIS — Z87891 Personal history of nicotine dependence: Secondary | ICD-10-CM | POA: Insufficient documentation

## 2014-11-11 LAB — CBC WITH DIFFERENTIAL/PLATELET
BASOS ABS: 0 10*3/uL (ref 0.0–0.1)
BASOS PCT: 0 % (ref 0–1)
EOS ABS: 0 10*3/uL (ref 0.0–0.7)
EOS PCT: 0 % (ref 0–5)
HEMATOCRIT: 39.9 % (ref 39.0–52.0)
HEMOGLOBIN: 12.7 g/dL — AB (ref 13.0–17.0)
Lymphocytes Relative: 8 % — ABNORMAL LOW (ref 12–46)
Lymphs Abs: 0.3 10*3/uL — ABNORMAL LOW (ref 0.7–4.0)
MCH: 26.7 pg (ref 26.0–34.0)
MCHC: 31.8 g/dL (ref 30.0–36.0)
MCV: 84 fL (ref 78.0–100.0)
MONO ABS: 0.2 10*3/uL (ref 0.1–1.0)
Monocytes Relative: 4 % (ref 3–12)
NEUTROS ABS: 3.3 10*3/uL (ref 1.7–7.7)
Neutrophils Relative %: 88 % — ABNORMAL HIGH (ref 43–77)
Platelets: 105 10*3/uL — ABNORMAL LOW (ref 150–400)
RBC: 4.75 MIL/uL (ref 4.22–5.81)
RDW: 19.9 % — ABNORMAL HIGH (ref 11.5–15.5)
WBC: 3.8 10*3/uL — ABNORMAL LOW (ref 4.0–10.5)

## 2014-11-11 LAB — URINALYSIS, ROUTINE W REFLEX MICROSCOPIC
Bilirubin Urine: NEGATIVE
GLUCOSE, UA: NEGATIVE mg/dL
KETONES UR: NEGATIVE mg/dL
Leukocytes, UA: NEGATIVE
Nitrite: NEGATIVE
PROTEIN: 30 mg/dL — AB
Specific Gravity, Urine: 1.021 (ref 1.005–1.030)
Urobilinogen, UA: 0.2 mg/dL (ref 0.0–1.0)
pH: 5.5 (ref 5.0–8.0)

## 2014-11-11 LAB — URINE MICROSCOPIC-ADD ON

## 2014-11-11 LAB — COMPREHENSIVE METABOLIC PANEL
ALBUMIN: 3.5 g/dL (ref 3.5–5.0)
ALK PHOS: 70 U/L (ref 38–126)
ALT: 26 U/L (ref 17–63)
AST: 29 U/L (ref 15–41)
Anion gap: 10 (ref 5–15)
BILIRUBIN TOTAL: 0.6 mg/dL (ref 0.3–1.2)
BUN: 27 mg/dL — ABNORMAL HIGH (ref 6–20)
CO2: 22 mmol/L (ref 22–32)
Calcium: 8.6 mg/dL — ABNORMAL LOW (ref 8.9–10.3)
Chloride: 100 mmol/L — ABNORMAL LOW (ref 101–111)
Creatinine, Ser: 1.13 mg/dL (ref 0.61–1.24)
GFR calc Af Amer: >60 mL/min (ref >60)
GFR, EST NON AFRICAN AMERICAN: 57 mL/min — AB (ref >60)
Glucose, Bld: 120 mg/dL — ABNORMAL HIGH (ref 65–99)
POTASSIUM: 4.2 mmol/L (ref 3.5–5.1)
SODIUM: 132 mmol/L — AB (ref 135–145)
TOTAL PROTEIN: 6.4 g/dL — AB (ref 6.5–8.1)

## 2014-11-11 LAB — I-STAT CG4 LACTIC ACID, ED: LACTIC ACID, VENOUS: 0.86 mmol/L (ref 0.5–2.0)

## 2014-11-11 MED ORDER — IOHEXOL 300 MG/ML  SOLN
100.0000 mL | Freq: Once | INTRAMUSCULAR | Status: AC | PRN
  Administered 2014-11-11: 100 mL via INTRAVENOUS

## 2014-11-11 MED ORDER — IOHEXOL 300 MG/ML  SOLN
50.0000 mL | Freq: Once | INTRAMUSCULAR | Status: AC | PRN
  Administered 2014-11-11: 50 mL via ORAL

## 2014-11-11 NOTE — ED Notes (Signed)
Pt here from home, complains of a fever with malaise since yesterday, has hx of colon cancer with recent surgery on the 19th

## 2014-11-11 NOTE — ED Provider Notes (Signed)
CSN: [id number]     Arrival date & time 11/11/14  1920 History   First MD Initiated Contact with Patient 11/11/14 2017     Chief Complaint  Patient presents with  . Fever     (Consider location/radiation/quality/duration/timing/severity/associated sxs/prior Treatment) HPI Comments: Patient here complaining of pain fevers today. Had diffuse weakness associated with this. Take Tylenol for his better. Denies any recent dysuria or hematuria. No cough or congestion. No neck pain or headaches. No rashes. Has had recent abdominal surgery for colon cancer but denies any abdominal pain. Patient's initial temperature was 103 and it is now down to 100.8.  Patient is a 81 y.o. male presenting with fever. The history is provided by the patient.  Fever   Past Medical History  Diagnosis Date  . CAD (coronary artery disease)   . Ischemic cardiomyopathy   . Dyslipidemia   . Cardiac conduction disorder   . Left ventricular apical thrombus   . Presence of stent in artery   . Arthritis   . MI (myocardial infarction)     2010  . Cancer     skin  . Diabetes mellitus without complication   . Colon polyps   . Anemia    Past Surgical History  Procedure Laterality Date  . Cardiac catheterization  12/04/2008    Proximal and Distal LAD, stented w/ a non-drug-eluting 3.5x45mm ZETA stent at 8atm, distally w/ a 3x83mm VISION stent, resulting in reduction of 100% occlusion to less than 30%.  . Cardiovascular stress test  01/13/2009    Significant perfusion seen in Apical, Basal Anterior, Basal Anteroseptal, and Apical Anterior regions-consistent w/ infarct/scar. EKG negative for ischemia. No ECG changes. No significant ischemia demonstrated.  . Transthoracic echocardiogram  03/25/2009    EF 35-45%, mild-moderate global hypokinesis  . Joint replacement      scopes bil knees  . Total shoulder arthroplasty Right 02/20/2014    Procedure: RIGHT TOTAL SHOULDER ARTHROPLASTY;  Surgeon: Marin Shutter, MD;  Location:  [facility];  Service: Orthopedics;  Laterality: Right;  . Left heart catheterization with coronary angiogram N/A 08/27/2013    Procedure: LEFT HEART CATHETERIZATION WITH CORONARY ANGIOGRAM;  Surgeon: Troy Sine, MD;  Location: [facility] CATH LAB;  Service: Cardiovascular;  Laterality: N/A;   Family History  Problem Relation Age of Onset  . Prostate cancer Father   . Esophageal cancer Neg Hx   . Colon cancer Neg Hx   . Stomach cancer Neg Hx   . Rectal cancer Neg Hx    History  Substance Use Topics  . Smoking status: Former Smoker    Quit date: 04/03/1953  . Smokeless tobacco: Never Used  . Alcohol Use: No    Review of Systems  Constitutional: Positive for fever.  All other systems reviewed and are negative.     Allergies  Oxycodone  Home Medications   Prior to Admission medications   Medication Sig Start Date End Date Taking? Authorizing Provider  CINNAMON PO Take 1,000 mg by mouth daily.    Historical Provider, MD  Coenzyme Q10 200 MG capsule Take 200 mg by mouth daily.    Historical Provider, MD  famotidine (PEPCID) 40 MG tablet TAKE 1 TABLET BY MOUTH DAILY. NEED APPOINTMENT FOR FUTURE REFILLS 09/22/14   Sanda Klein, MD  Garlic 2706 MG CAPS Take 1 capsule by mouth daily.    Historical Provider, MD  Iron TABS Take 1 tablet by mouth daily.    Historical Provider, MD  isosorbide mononitrate (IMDUR) 30  MG 24 hr tablet TAKE 1 TABLET BY MOUTH EVERY DAY 10/09/14   Sanda Klein, MD  LIVALO 2 MG TABS Take 1 tablet by mouth every Monday, Wednesday, and Friday.  02/26/13   Historical Provider, MD  losartan (COZAAR) 25 MG tablet Take 1 tablet (25 mg total) by mouth daily. 10/21/14   Mihai Croitoru, MD  metFORMIN (GLUCOPHAGE) 500 MG tablet Take 250 mg by mouth daily with breakfast.    Historical Provider, MD  Multiple Vitamins-Minerals (CENTRUM SILVER ULTRA MENS PO) Take 1 tablet by mouth daily.    Historical Provider, MD  Omega-3 Fatty Acids (FISH OIL) 1000 MG CAPS Take 1 capsule by mouth  daily.    Historical Provider, MD   BP 124/60 mmHg  Pulse 93  Temp(Src) 98.3 F (36.8 C) (Oral)  Resp 20  SpO2 92% Physical Exam  Constitutional: He is oriented to person, place, and time. He appears well-developed and well-nourished.  Non-toxic appearance. No distress.  HENT:  Head: Normocephalic and atraumatic.  Eyes: Conjunctivae, EOM and lids are normal. Pupils are equal, round, and reactive to light.  Neck: Normal range of motion. Neck supple. No tracheal deviation present. No thyroid mass present.  Cardiovascular: Normal rate, regular rhythm and normal heart sounds.  Exam reveals no gallop.   No murmur heard. Pulmonary/Chest: Effort normal and breath sounds normal. No stridor. No respiratory distress. He has no decreased breath sounds. He has no wheezes. He has no rhonchi. He has no rales.  Abdominal: Soft. Normal appearance and bowel sounds are normal. He exhibits no distension. There is no tenderness. There is no rigidity, no rebound, no guarding and no CVA tenderness.  Surgical incision clean dry and intact. No evidence of infection.  Musculoskeletal: Normal range of motion. He exhibits no edema or tenderness.  Neurological: He is alert and oriented to person, place, and time. He has normal strength. No cranial nerve deficit or sensory deficit. GCS eye subscore is 4. GCS verbal subscore is 5. GCS motor subscore is 6.  Skin: Skin is warm and dry. No abrasion and no rash noted.  Psychiatric: He has a normal mood and affect. His speech is normal and behavior is normal.  Nursing note and vitals reviewed.   ED Course  Procedures (including critical care time) Labs Review Labs Reviewed  CULTURE, BLOOD (ROUTINE X 2)  CULTURE, BLOOD (ROUTINE X 2)  URINE CULTURE  CBC WITH DIFFERENTIAL/PLATELET  COMPREHENSIVE METABOLIC PANEL  URINALYSIS, ROUTINE W REFLEX MICROSCOPIC (NOT AT [facility])  I-STAT CG4 LACTIC ACID, ED    Imaging Review No results found.   EKG Interpretation None       MDM   Final diagnoses:  Fever    Patient had negative urine, chest x-ray, abdominal CT. Repeat temperature he remains afebrile. No signs of sepsis. Patient sent for discharge   Lacretia Leigh, MD 11/12/14 [phone]

## 2014-11-11 NOTE — Telephone Encounter (Signed)
Pt was seen in [facility] today secondary to being febrile to 101 on Monday that was accompanied with some n/v x 1.  Pt calls in thie evening with another episode of nausea and dry heaves.  Pt's wife states that he has Temp: 103.  His abd was benign in clinic and he was feeling well.  Secondary to the fever and nausea I recommended he proceed to [facility] ED for eval with CT to eval for any intraabdominal abscess and lab studies.  Pts wife understands and will be proceeding to [facility] ED.

## 2014-11-11 NOTE — ED Notes (Signed)
Bed: WA20 Expected date:  Expected time:  Means of arrival:  Comments: Fever, colon cancer

## 2014-11-11 NOTE — ED Notes (Signed)
Pt took ibuprofen earlier and fever came down from 103 to 100.8 per EMS

## 2014-11-11 NOTE — ED Notes (Signed)
Patient reports feeling feverish since yesterday evening.  He states that he called his doctor today because he couldn't walk.  Reports, "My feet wouldn't work."

## 2014-11-12 ENCOUNTER — Other Ambulatory Visit: Payer: Self-pay | Admitting: General Surgery

## 2014-11-12 DIAGNOSIS — R5082 Postprocedural fever: Secondary | ICD-10-CM

## 2014-11-12 DIAGNOSIS — R262 Difficulty in walking, not elsewhere classified: Secondary | ICD-10-CM

## 2014-11-12 LAB — URINE CULTURE
COLONY COUNT: NO GROWTH
CULTURE: NO GROWTH

## 2014-11-12 NOTE — Discharge Instructions (Signed)
Use Tylenol as directed and follow-up with your doctor for recheck this week if not better Fever, Adult A fever is a higher than normal body temperature. In an adult, an oral temperature around 98.6 F (37 C) is considered normal. A temperature of 100.4 F (38 C) or higher is generally considered a fever. Mild or moderate fevers generally have no long-term effects and often do not require treatment. Extreme fever (greater than or equal to 106 F or 41.1 C) can cause seizures. The sweating that may occur with repeated or prolonged fever may cause dehydration. Elderly people can develop confusion during a fever. A measured temperature can vary with:  Age.  Time of day.  Method of measurement (mouth, underarm, rectal, or ear). The fever is confirmed by taking a temperature with a thermometer. Temperatures can be taken different ways. Some methods are accurate and some are not.  An oral temperature is used most commonly. Electronic thermometers are fast and accurate.  An ear temperature will only be accurate if the thermometer is positioned as recommended by the manufacturer.  A rectal temperature is accurate and done for those adults who have a condition where an oral temperature cannot be taken.  An underarm (axillary) temperature is not accurate and not recommended. Fever is a symptom, not a disease.  CAUSES   Infections commonly cause fever.  Some noninfectious causes for fever include:  Some arthritis conditions.  Some thyroid or adrenal gland conditions.  Some immune system conditions.  Some types of cancer.  A medicine reaction.  High doses of certain street drugs such as methamphetamine.  Dehydration.  Exposure to high outside or room temperatures.  Occasionally, the source of a fever cannot be determined. This is sometimes called a "fever of unknown origin" (FUO).  Some situations may lead to a temporary rise in body temperature that may go away on its own.  Examples are:  Childbirth.  Surgery.  Intense exercise. HOME CARE INSTRUCTIONS   Take appropriate medicines for fever. Follow dosing instructions carefully. If you use acetaminophen to reduce the fever, be careful to avoid taking other medicines that also contain acetaminophen. Do not take aspirin for a fever if you are younger than age 55. There is an association with Reye's syndrome. Reye's syndrome is a rare but potentially deadly disease.  If an infection is present and antibiotics have been prescribed, take them as directed. Finish them even if you start to feel better.  Rest as needed.  Maintain an adequate fluid intake. To prevent dehydration during an illness with prolonged or recurrent fever, you may need to drink extra fluid.Drink enough fluids to keep your urine clear or pale yellow.  Sponging or bathing with room temperature water may help reduce body temperature. Do not use ice water or alcohol sponge baths.  Dress comfortably, but do not over-bundle. SEEK MEDICAL CARE IF:   You are unable to keep fluids down.  You develop vomiting or diarrhea.  You are not feeling at least partly better after 3 days.  You develop new symptoms or problems. SEEK IMMEDIATE MEDICAL CARE IF:   You have shortness of breath or trouble breathing.  You develop excessive weakness.  You are dizzy or you faint.  You are extremely thirsty or you are making little or no urine.  You develop new pain that was not there before (such as in the head, neck, chest, back, or abdomen).  You have persistent vomiting and diarrhea for more than 1 to 2 days.  You develop a stiff neck or your eyes become sensitive to light.  You develop a skin rash.  You have a fever or persistent symptoms for more than 2 to 3 days.  You have a fever and your symptoms suddenly get worse. MAKE SURE YOU:   Understand these instructions.  Will watch your condition.  Will get help right away if you are not  doing well or get worse. Document Released: 11/23/2000 Document Revised: 10/14/2013 Document Reviewed: 03/31/2011 [facility] Patient Information 2015 [place], [place]. This information is not intended to replace advice given to you by your health care provider. Make sure you discuss any questions you have with your health care provider.

## 2014-11-13 ENCOUNTER — Ambulatory Visit (HOSPITAL_COMMUNITY)
Admission: RE | Admit: 2014-11-13 | Discharge: 2014-11-13 | Disposition: A | Discharge status: Home / Self Care | Payer: Medicare Other | Source: Ambulatory Visit | Attending: General Surgery | Admitting: General Surgery

## 2014-11-13 DIAGNOSIS — R509 Fever, unspecified: Secondary | ICD-10-CM | POA: Diagnosis not present

## 2014-11-13 DIAGNOSIS — R262 Difficulty in walking, not elsewhere classified: Secondary | ICD-10-CM | POA: Insufficient documentation

## 2014-11-13 NOTE — Progress Notes (Signed)
*  PRELIMINARY RESULTS* Vascular Ultrasound Lower extremity venous duplex has been completed.  Preliminary findings: negative for DVT.   Landry Mellow, RDMS, RVT  11/13/2014, 12:24 PM

## 2014-11-14 ENCOUNTER — Other Ambulatory Visit: Payer: Self-pay | Admitting: Cardiovascular Disease

## 2014-11-14 NOTE — Telephone Encounter (Signed)
Rx has been sent to the pharmacy electronically. ° °

## 2014-11-18 ENCOUNTER — Other Ambulatory Visit: Payer: Self-pay | Admitting: General Surgery

## 2014-11-18 DIAGNOSIS — C189 Malignant neoplasm of colon, unspecified: Secondary | ICD-10-CM

## 2014-11-18 DIAGNOSIS — C799 Secondary malignant neoplasm of unspecified site: Principal | ICD-10-CM

## 2014-11-18 LAB — CULTURE, BLOOD (ROUTINE X 2)
CULTURE: NO GROWTH
Culture: NO GROWTH

## 2014-11-20 ENCOUNTER — Ambulatory Visit
Admission: RE | Admit: 2014-11-20 | Discharge: 2014-11-20 | Disposition: A | Discharge status: Home / Self Care | Payer: Medicare Other | Source: Ambulatory Visit | Attending: General Surgery | Admitting: General Surgery

## 2014-11-20 MED ORDER — IOPAMIDOL (ISOVUE-300) INJECTION 61%
75.0000 mL | Freq: Once | INTRAVENOUS | Status: AC | PRN
  Administered 2014-11-20: 75 mL via INTRAVENOUS

## 2014-11-24 ENCOUNTER — Telehealth: Payer: Self-pay | Admitting: Oncology

## 2014-11-24 ENCOUNTER — Encounter: Payer: Self-pay | Admitting: Oncology

## 2014-11-24 ENCOUNTER — Ambulatory Visit: Payer: Medicare Other

## 2014-11-24 ENCOUNTER — Encounter: Payer: Self-pay | Admitting: *Deleted

## 2014-11-24 ENCOUNTER — Ambulatory Visit (HOSPITAL_BASED_OUTPATIENT_CLINIC_OR_DEPARTMENT_OTHER): Payer: Medicare Other | Admitting: Oncology

## 2014-11-24 VITALS — BP 126/55 | HR 61 | Temp 97.8°F | Resp 18 | Ht 66.0 in | Wt 191.3 lb

## 2014-11-24 DIAGNOSIS — K635 Polyp of colon: Secondary | ICD-10-CM | POA: Diagnosis not present

## 2014-11-24 DIAGNOSIS — D509 Iron deficiency anemia, unspecified: Secondary | ICD-10-CM

## 2014-11-24 DIAGNOSIS — C182 Malignant neoplasm of ascending colon: Secondary | ICD-10-CM

## 2014-11-24 DIAGNOSIS — C189 Malignant neoplasm of colon, unspecified: Secondary | ICD-10-CM

## 2014-11-24 NOTE — [facility] Oncology Navigator Note (Signed)
Oncology Nurse Navigator Documentation  Oncology Nurse Navigator Flowsheets 11/24/2014  Referral date to RadOnc/MedOnc 11/05/2014  Navigator Encounter Type Initial MedOnc  Patient Visit Type Medonc  Treatment Phase Treatment planning  Barriers/Navigation Needs Education-reviewed xeloda side effects, chemo class  Support Groups/Services GI  Time Spent with Patient 38   Met with patient, wife and son during new patient visit. Explained the role of the GI Nurse Navigator and provided New Patient Packet with information on: 1.  Colorectal cancer 2. Support groups 3. Advanced Directives 4. Fall Safety Plan Answered questions, reviewed current treatment plan using TEACH back and provided emotional support. Provided copy of current treatment plan. He reports no transportation issues, independent in ADL's, house is one story, has prescription insurance and supportive family.  Merceda Elks, RN, BSN GI Oncology [place]

## 2014-11-24 NOTE — Progress Notes (Signed)
[facility] Patient Consult   Referring MD: Daemion Mcniel 81 y.o.  03-05-1927    Reason for Referral: Colon cancer   HPI: Mr. Hoshino was noted to be anemic when he saw Dr. Maudie Mercury for a physical exam. He was referred to Dr. Olevia Perches and was taken to a colonoscopy and upper endoscopy 10/08/2014 a ulcerated mass was found in the ascending colon. Multiple biopsies were performed. Multiple polyps were removed. A gastric polyp was noted 1 cm distal to the squamocolumnar junction measuring 15 mm. The remainder of the stomach appeared normal. The polyp was removed. The pathology [id number]) confirmed a hyperplastic gastric cardia polyp. Polyps from the cecum and sigmoid colon revealed tubular adenomas and a hyperplastic polyp. The biopsy from the hepatic flexure revealed at least high-grade dysplasia concerning for invasive adenocarcinoma. A CT of the abdomen and pelvis on 10/13/2014 revealed no worrisome pulmonary lesions. Tiny low-attenuation lesions in the liver were felt to be cysts. A mass was noted in the ascending colon with an adjacent 10.5 mm lymph node.  He was referred to Dr. Marcello Moores and was taken to a robotic right colectomy on 10/30/2014. A hepatic flexure mass was noted. No evidence of metastatic disease. The pathology [id number]) revealed an invasive adenocarcinoma extending into pericolonic connective tissue. Metastatic carcinoma was identified in 1 of 19 lymph nodes. There was a separate to pleura adenoma without high-grade dysplasia. Lymphovascular and perineural invasion are present. No tumor deposits. Resection margins are negative. No macroscopic tumor perforation. The tumor was in the ascending colon. No loss of mismatch repair protein expression.  He was referred for a staging CT of the chest on 11/20/2014. There are 2 questionable areas of nodularity in the right lung.  He has recovered from surgery. He reports feeling well prior to surgery. He had  a fever approximately 1 week following surgery. This spontaneously resolved.   Past Medical History  Diagnosis Date  . CAD (coronary artery disease)   . Ischemic cardiomyopathy   . Dyslipidemia   . Cardiac conduction disorder   . Left ventricular apical thrombus   . Presence of stent in artery   . Arthritis   . MI (myocardial infarction)     2010  . Cancer     skin-basal cell carcinoma of the left face   . Diabetes mellitus without complication   . Colon polyps  10/08/2014   . Anemia-microcytic   April 2016     Past Surgical History  Procedure Laterality Date  . Cardiac catheterization  12/04/2008    Proximal and Distal LAD, stented w/ a non-drug-eluting 3.5x58m ZETA stent at 8atm, distally w/ a 3x139mVISION stent, resulting in reduction of 100% occlusion to less than 30%.  . Cardiovascular stress test  01/13/2009    Significant perfusion seen in Apical, Basal Anterior, Basal Anteroseptal, and Apical Anterior regions-consistent w/ infarct/scar. EKG negative for ischemia. No ECG changes. No significant ischemia demonstrated.  . Transthoracic echocardiogram  03/25/2009    EF 35-45%, mild-moderate global hypokinesis  . Joint replacement      scopes bil knees  . Total shoulder arthroplasty Right 02/20/2014    Procedure: RIGHT TOTAL SHOULDER ARTHROPLASTY;  Surgeon: KeMarin ShutterMD;  [facility];  Laterality: Right;  . Left heart catheterization with coronary angiogram N/A 08/27/2013    Procedure: LEFT HEART CATHETERIZATION WITH CORONARY ANGIOGRAM;  Surgeon: ThTroy SineMD;  [facility];  Service: Cardiovascular;  Laterality: N/A;   .  Robotic right colectomy 10/30/2014  Medications: Reviewed  Allergies:  Allergies  Allergen Reactions  . Oxycodone     "makes him crazy"     Family history: His father had prostate cancer. A sister had an oral cancer. No other family history of cancer.  Social History:   He lives with his wife in  [place]. He worked in an [employer] representative. He does not use cigarettes or alcohol. No risk factor for HIV or hepatitis.  History  Alcohol Use No    History  Smoking status  . Former Smoker  . Quit date: 04/03/1953  Smokeless tobacco  . Never Used      ROS:   Positives include: Pain in the left shoulder (scheduled for shoulder replacement surgery in October 2016), chronic numbness of the feet for fever 1 week following surgery  A complete ROS was otherwise negative.  Physical Exam:  Blood pressure 126/55, pulse 61, temperature 97.8 F (36.6 C), temperature source Oral, resp. rate 18, height '5\' 6"'  (1.676 m), weight 191 lb 4.8 oz (86.773 kg), SpO2 97 %.  HEENT: Oropharynx without mass, neck without mass Lungs: Clear bilaterally Cardiac: Regular rate and rhythm Abdomen: Healed surgical incisions, no hepatosplenomegaly, nontender, no mass GU: Testes without mass  Vascular: No leg edema Lymph nodes: No cervical, supra-clavicular, axillary, or inguinal nodes Neurologic: Alert and oriented, the motor exam appears intact in the upper and lower extremities Skin: No rash Musculoskeletal: No spine tenderness. Pain with motion at the left shoulder   LAB:  CBC  Lab Results  Component Value Date   WBC 3.8* 11/11/2014   HGB 12.7* 11/11/2014   HCT 39.9 11/11/2014   MCV 84.0 11/11/2014   PLT 105* 11/11/2014   NEUTROABS 3.3 11/11/2014     CMP      Component Value Date/Time   NA 132* 11/11/2014 2022   K 4.2 11/11/2014 2022   CL 100* 11/11/2014 2022   CO2 22 11/11/2014 2022   GLUCOSE 120* 11/11/2014 2022   BUN 27* 11/11/2014 2022   CREATININE 1.13 11/11/2014 2022   CALCIUM 8.6* 11/11/2014 2022   PROT 6.4* 11/11/2014 2022   ALBUMIN 3.5 11/11/2014 2022   AST 29 11/11/2014 2022   ALT 26 11/11/2014 2022   ALKPHOS 70 11/11/2014 2022   BILITOT 0.6 11/11/2014 2022   GFRNONAA 57* 11/11/2014 2022   GFRAA >60 11/11/2014 2022    Lab Results  Component Value Date   CEA  2.4 10/09/2014    Imaging:  As per history of present illness, images reviewed   Assessment/Plan:   1. Stage III (T3, N1) adenocarcinoma of the ascending colon, status post a right colectomy 10/30/2014  1 of 19 lymph nodes positive for metastases carcinoma, lymphovascular and perineural invasion present  Normal mismatch repair protein expression  2. History of coronary artery disease  3.   Microcytic anemia April 2016-likely iron deficiency anemia secondary to the ascending colon tumor  4.   Multiple colon polyps and a gastric polyp 10/08/2014   Disposition:   Mr. Silvernail has been diagnosed with stage III colon cancer. I reviewed the details of the surgical pathology report and discussed the prognosis with Mr. Zinni and his family. There is a significant chance of developing recurrent colon cancer over the next several years. He appears to be in good health for his age with an expected survival beyond the next 5 years. We discussed data confirming a decrease in the relapse rate with adjuvant 5-fluorouracil chemotherapy in this setting. I  recommend adjuvant capecitabine chemotherapy and he agrees.  We reviewed the potential toxicities associated with capecitabine including the chance for mucositis, diarrhea, hematologic toxicity, rash, hyperpigmentation, and the hand/foot syndrome. He will attend a chemotherapy teaching class.  He does not appear to have hereditary non-polyposis colon cancer syndrome. His family members remain at increased risk of colon cancer and should receive appropriate screening.  The plan is to begin a first cycle of adjuvant capecitabine on 12/01/2014. He will return for an office visit and CBC on 12/19/2014.  Approximately 50 minutes were spent with the patient today. The majority of the time was used for counseling and coordination of care.  [place], [place] 11/24/2014, 5:17 PM

## 2014-11-24 NOTE — Telephone Encounter (Signed)
cld pt to adv that chemo edu class @12  for 6/16

## 2014-11-24 NOTE — Progress Notes (Signed)
Checked in new pt with no financial concerns prior to seeing the dr.  Pt has 2 insurances so financial assistance may not be needed but he has my card for any questions or concerns.  ° °

## 2014-11-24 NOTE — Telephone Encounter (Signed)
per pof to sch pt appt-gave pt copy of avs °

## 2014-11-25 ENCOUNTER — Other Ambulatory Visit: Payer: Self-pay | Admitting: *Deleted

## 2014-11-25 ENCOUNTER — Encounter: Payer: Self-pay | Admitting: *Deleted

## 2014-11-25 MED ORDER — CAPECITABINE 500 MG PO TABS: ORAL_TABLET | ORAL | Status: DC

## 2014-11-25 NOTE — [facility] Oncology Navigator Note (Signed)
Requested MSI testing on following case at Dr. Gearldine Shown request via email to pathology:  Patient: Blake Patterson, Blake Patterson Collected: 10/30/2014 Client: [facility] Accession: [id number] Received: 84/11/9859 [name], MD DOB: [birth year] Age: 81 Gender: M Reported: 11/03/2014 [address] Patient Ph: [phone] MRN #: [id number] [place], [place] [address] Visit #: [id number][email or web address] Chart #: Phone: [phone] Fax:

## 2014-11-26 ENCOUNTER — Telehealth: Payer: Self-pay | Admitting: *Deleted

## 2014-11-26 NOTE — Telephone Encounter (Signed)
Oncology Nurse Navigator Documentation  Oncology Nurse Navigator Flowsheets 11/26/2014  Referral date to RadOnc/MedOnc -  Navigator Encounter Type Telephone-spoke with wife  Patient Visit Type -  Treatment Phase Tx planning: made wife aware that [place] said he can pick up his Xeloda tomorrow and there was no co pay. They will get medication after his chemo class tomorrow.  Barriers/Navigation Needs No barriers at this time  Support Groups/Services -  Time Spent with Patient 5

## 2014-11-27 ENCOUNTER — Ambulatory Visit: Payer: Medicare Other

## 2014-11-27 ENCOUNTER — Encounter: Payer: Self-pay | Admitting: *Deleted

## 2014-11-28 ENCOUNTER — Telehealth: Payer: Self-pay | Admitting: *Deleted

## 2014-11-28 ENCOUNTER — Other Ambulatory Visit (HOSPITAL_COMMUNITY)
Admission: RE | Admit: 2014-11-28 | Discharge: 2014-11-28 | Disposition: A | Discharge status: Home / Self Care | Payer: Medicare Other | Source: Ambulatory Visit | Attending: Oncology | Admitting: Oncology

## 2014-11-28 DIAGNOSIS — C189 Malignant neoplasm of colon, unspecified: Secondary | ICD-10-CM | POA: Insufficient documentation

## 2014-11-28 NOTE — Telephone Encounter (Signed)
VM message received @ 9am from pt's wife. Returned call to her. He question was to clarify instructions for Xeloda which pt starts on Monday, 12/01/14. Reviewed instructions with her. She voiced understanding. No other needs identified.

## 2014-12-03 ENCOUNTER — Encounter (HOSPITAL_COMMUNITY): Payer: Self-pay

## 2014-12-05 ENCOUNTER — Telehealth: Payer: Self-pay | Admitting: *Deleted

## 2014-12-05 NOTE — Telephone Encounter (Signed)
Urgent call via Triage pager from pt's wife. Immediate call back to wife. She states that patient was carrying garbage can to the road and experienced chest pain.  Pt has h/o heart disease. He took 2 nitro and is experiencing some relief. Wife wanted to know if this was related to the oral chemo pt has started on 12/01/14. Advised wife that is likely NOT related and that pt needs to go to ED if pain continues and contact his cardiologist.  Wife verbalized understanding and will take pt to ED if pain does not resolve in the next few minutes.

## 2014-12-10 ENCOUNTER — Telehealth: Payer: Self-pay | Admitting: *Deleted

## 2014-12-10 NOTE — Telephone Encounter (Signed)
Oncology Nurse Navigator Documentation  Oncology Nurse Navigator Flowsheets 12/10/2014  Referral date to RadOnc/MedOnc -  Navigator Encounter Type Telephone-spoke with wife  Patient Visit Type -  Treatment Phase Treatment-Xeloda-started 12/01/14  Barriers/Navigation Needs No barriers at this time-wife reports no adverse effect from Xeloda. Has not had another episode of chest pain since last week.  Support Groups/Services -  Time Spent with Patient 10

## 2014-12-12 ENCOUNTER — Telehealth: Payer: Self-pay | Admitting: *Deleted

## 2014-12-12 ENCOUNTER — Telehealth: Payer: Self-pay | Admitting: Nurse Practitioner

## 2014-12-12 ENCOUNTER — Ambulatory Visit (HOSPITAL_BASED_OUTPATIENT_CLINIC_OR_DEPARTMENT_OTHER): Payer: Medicare Other | Admitting: Nurse Practitioner

## 2014-12-12 VITALS — BP 119/60 | HR 94 | Temp 98.1°F | Resp 18 | Wt 192.0 lb

## 2014-12-12 DIAGNOSIS — L271 Localized skin eruption due to drugs and medicaments taken internally: Secondary | ICD-10-CM | POA: Diagnosis not present

## 2014-12-12 DIAGNOSIS — H578 Other specified disorders of eye and adnexa: Secondary | ICD-10-CM | POA: Diagnosis not present

## 2014-12-12 DIAGNOSIS — C182 Malignant neoplasm of ascending colon: Secondary | ICD-10-CM | POA: Diagnosis present

## 2014-12-12 DIAGNOSIS — C189 Malignant neoplasm of colon, unspecified: Secondary | ICD-10-CM

## 2014-12-12 DIAGNOSIS — H5789 Other specified disorders of eye and adnexa: Secondary | ICD-10-CM

## 2014-12-12 NOTE — Telephone Encounter (Signed)
Patient's wife Stanton Kidney called stating that he is having a possible reaction to Xeloda. Patient symptoms started yesterday which consisted of face/feet redness and hives. Patient family member states the hives has resolved. Patient wife denies any swelling, pain, and itching. RN Amy Horton and LPN Clarise Cruz notified. Patient family member notified and verbalized understanding. Message sent NP Selena Lesser for Symptom Management office visit. Urgent POF sent to scheduler.

## 2014-12-12 NOTE — Telephone Encounter (Signed)
Office visit added per 07/01 POF, pt is aware to be added to NP/CB schedule... K J

## 2014-12-19 ENCOUNTER — Encounter: Payer: Self-pay | Admitting: Nurse Practitioner

## 2014-12-19 ENCOUNTER — Telehealth: Payer: Self-pay | Admitting: Oncology

## 2014-12-19 ENCOUNTER — Ambulatory Visit (HOSPITAL_BASED_OUTPATIENT_CLINIC_OR_DEPARTMENT_OTHER): Payer: Medicare Other | Admitting: Oncology

## 2014-12-19 ENCOUNTER — Other Ambulatory Visit (HOSPITAL_BASED_OUTPATIENT_CLINIC_OR_DEPARTMENT_OTHER): Payer: Medicare Other

## 2014-12-19 VITALS — BP 133/60 | HR 62 | Temp 99.0°F | Resp 18 | Ht 66.0 in | Wt 186.2 lb

## 2014-12-19 DIAGNOSIS — C189 Malignant neoplasm of colon, unspecified: Secondary | ICD-10-CM

## 2014-12-19 DIAGNOSIS — D509 Iron deficiency anemia, unspecified: Secondary | ICD-10-CM

## 2014-12-19 DIAGNOSIS — C182 Malignant neoplasm of ascending colon: Secondary | ICD-10-CM

## 2014-12-19 DIAGNOSIS — L271 Localized skin eruption due to drugs and medicaments taken internally: Secondary | ICD-10-CM | POA: Insufficient documentation

## 2014-12-19 DIAGNOSIS — H5789 Other specified disorders of eye and adnexa: Secondary | ICD-10-CM | POA: Insufficient documentation

## 2014-12-19 LAB — CBC WITH DIFFERENTIAL/PLATELET
BASO%: 0.7 % (ref 0.0–2.0)
Basophils Absolute: 0 10*3/uL (ref 0.0–0.1)
EOS%: 6.6 % (ref 0.0–7.0)
Eosinophils Absolute: 0.4 10*3/uL (ref 0.0–0.5)
HEMATOCRIT: 39.1 % (ref 38.4–49.9)
HGB: 12.7 g/dL — ABNORMAL LOW (ref 13.0–17.1)
LYMPH%: 18.8 % (ref 14.0–49.0)
MCH: 28.6 pg (ref 27.2–33.4)
MCHC: 32.5 g/dL (ref 32.0–36.0)
MCV: 88.1 fL (ref 79.3–98.0)
MONO#: 0.7 10*3/uL (ref 0.1–0.9)
MONO%: 10.9 % (ref 0.0–14.0)
NEUT#: 3.8 10*3/uL (ref 1.5–6.5)
NEUT%: 63 % (ref 39.0–75.0)
Platelets: 114 10*3/uL — ABNORMAL LOW (ref 140–400)
RBC: 4.44 10*6/uL (ref 4.20–5.82)
RDW: 17.2 % — ABNORMAL HIGH (ref 11.0–14.6)
WBC: 6.1 10*3/uL (ref 4.0–10.3)
lymph#: 1.1 10*3/uL (ref 0.9–3.3)

## 2014-12-19 NOTE — Telephone Encounter (Signed)
Pt confirming MD per 07/08 POF, gave pt AVS and Calendar.... KJ

## 2014-12-19 NOTE — Assessment & Plan Note (Signed)
Patient is status post colectomy in May 2016.  Patient initiated Xeloda oral therapy on 12/01/2014.  He reports new onset erythema to the palms of his hands and the soles of his feet; with associated tenderness and stinging/burning for the past several days.  He reports that it has become difficult to walk due to the tenderness of the soles of his feet.  He is also complaining of excessive tearing of his eyes.  He denies any purulent discharge to his eyes; and also denies any vision changes whatsoever.  He also reports that he did experience some rash/hives; which have completely resolved at this point.  Patient was instructed to take the Xeloda for 2 weeks on; with one week off.  Patient states he has only 5 doses of the Xeloda left for this weekend until he is on his one-week break.  Does appear the patient has developed some moderate hand/foot syndrome; and patient was advised to hold his last 5 doses of Xeloda to see if the hand/foot syndrome will improve.

## 2014-12-19 NOTE — Assessment & Plan Note (Signed)
Patient is status post colectomy in May 2016.  Patient initiated Xeloda oral therapy on 12/01/2014.  He reports new onset erythema to the palms of his hands and the soles of his feet; with associated tenderness and stinging/burning for the past several days.  He reports that it has become difficult to walk due to the tenderness of the soles of his feet.  He is also complaining of excessive tearing of his eyes.  He denies any purulent discharge to his eyes; and also denies any vision changes whatsoever.  He also reports that he did experience some rash/hives; which have completely resolved at this point.  Patient was previously instructed to take the Xeloda for 2 weeks on; with one week off.  Patient states he has only 5 doses of the Xeloda left for this weekend until he is on his one-week break.  Patient with both the palms of his hands and the soles of his feet with red/purple erythema.  It also appears that the soles of his feet are quite tender with any palpation.  Patient is finding it slightly difficult to walk due to the soreness of his feet.  Does appear the patient has developed some moderate hand/foot syndrome; and patient was advised to hold his last 5 doses of Xeloda to see if the hand/foot syndrome will improve.    Patient was also encouraged to avoid extremes in temperature to both his hands and his feet.  He was advised to keep his hands and feet well moisturized and protected as well.

## 2014-12-19 NOTE — Assessment & Plan Note (Signed)
Patient initiated Xeloda oral therapy on 12/01/2014.  Patient is complaining of excessive tearing to his bilateral eyes for the past several days.  He feels that his eyes are stinging.  He denies any purulent discharge or vision changes to his bilateral eyes.  On exam- Patient does appear to have sclera erythema/irritation; and excessive tearing.  There is no purulent discharge noted.  Patient continues to deny any vision changes whatsoever.  Reviewed with patient that the Xeloda does report an approximate 13-15% risk of eye irritation.  Patient was advised to try over-the-counter eye moisturizing drops to see if this will relieve irritation.  Also suggested that the patient follow-up with an ophthalmologist for any persistent or worsening symptoms whatsoever.

## 2014-12-19 NOTE — Progress Notes (Signed)
SYMPTOM MANAGEMENT CLINIC   HPI: Blake Patterson 81 y.o. male diagnosed with colon cancer.  Patient is status post colectomy in May 2016.  Currently undergoing Xeloda oral therapy.  Patient is status post colectomy in May 2016.  Patient initiated Xeloda oral therapy on 12/01/2014.  He reports new onset erythema to the palms of his hands and the soles of his feet; with associated tenderness and stinging/burning for the past several days.  He reports that it has become difficult to walk due to the tenderness of the soles of his feet.  He is also complaining of excessive tearing of his eyes.  He denies any purulent discharge to his eyes; and also denies any vision changes whatsoever.  He also reports that he did experience some rash/hives; which have completely resolved at this point.  Patient was instructed to take the Xeloda for 2 weeks on; with one week off.  Patient states he has only 5 doses of the Xeloda left for this weekend until he is on his one-week break.  HPI  ROS  Past Medical History  Diagnosis Date  . CAD (coronary artery disease)   . Ischemic cardiomyopathy   . Dyslipidemia   . Cardiac conduction disorder   . Left ventricular apical thrombus   . Presence of stent in artery   . Arthritis   . MI (myocardial infarction)     2010  . Cancer     skin  . Diabetes mellitus without complication   . Colon polyps   . Anemia     Past Surgical History  Procedure Laterality Date  . Cardiac catheterization  12/04/2008    Proximal and Distal LAD, stented w/ a non-drug-eluting 3.5x53m ZETA stent at 8atm, distally w/ a 3x127mVISION stent, resulting in reduction of 100% occlusion to less than 30%.  . Cardiovascular stress test  01/13/2009    Significant perfusion seen in Apical, Basal Anterior, Basal Anteroseptal, and Apical Anterior regions-consistent w/ infarct/scar. EKG negative for ischemia. No ECG changes. No significant ischemia demonstrated.  . Transthoracic echocardiogram   03/25/2009    EF 35-45%, mild-moderate global hypokinesis  . Joint replacement      scopes bil knees  . Total shoulder arthroplasty Right 02/20/2014    Procedure: RIGHT TOTAL SHOULDER ARTHROPLASTY;  Surgeon: KeMarin ShutterMD;  [facility];  Laterality: Right;  . Left heart catheterization with coronary angiogram N/A 08/27/2013    Procedure: LEFT HEART CATHETERIZATION WITH CORONARY ANGIOGRAM;  Surgeon: ThTroy SineMD;  [facility];  Service: Cardiovascular;  Laterality: N/A;    has CAD (coronary artery disease); Cardiomyopathy, ischemic; Hyperlipidemia; Intermediate coronary syndrome; Unstable angina; S/P shoulder replacement; Anemia, iron deficiency; Colon cancer; Hand foot syndrome; and Eye irritation on his problem list.    is allergic to oxycodone.    Medication List       This list is accurate as of: 12/12/14 11:59 PM.  Always use your most recent med list.               aspirin EC 81 MG tablet  Take 81 mg by mouth every evening.     capecitabine 500 MG tablet  Commonly known as:  XELODA  Take 4 tabs (2,000 mg) PO QAM. 3 tabs (1,500 mg) PO QPM for 14 days then 7 days off.     CENTRUM SILVER ULTRA MENS PO  Take 1 tablet by mouth daily.     CINNAMON PO  Take 1,000 mg  by mouth daily.     Coenzyme Q10 200 MG capsule  Take 200 mg by mouth daily.     famotidine 40 MG tablet  Commonly known as:  PEPCID  TAKE 1 TABLET BY MOUTH DAILY. NEED APPOINTMENT FOR FUTURE REFILLS     Fish Oil 1000 MG Caps  Take 1 capsule by mouth daily.     Garlic 5885 MG Caps  Take 1 capsule by mouth daily.     Iron Tabs  Take 1 tablet by mouth daily.     isosorbide mononitrate 30 MG 24 hr tablet  Commonly known as:  IMDUR  TAKE 1 TABLET BY MOUTH EVERY DAY     LIVALO 2 MG Tabs  Generic drug:  Pitavastatin Calcium  Take 1 tablet by mouth every Monday, Wednesday, and Friday.     losartan 25 MG tablet  Commonly known as:  COZAAR  Take 1 tablet (25  mg total) by mouth daily.     metFORMIN 500 MG tablet  Commonly known as:  GLUCOPHAGE  Take 250 mg by mouth daily with breakfast.     NITROSTAT 0.4 MG SL tablet  Generic drug:  nitroGLYCERIN  PLACE 1 TABLET UNDER TONUGE UP TO 3 TIMES AS NEEDED FOR CHEST PAIN         PHYSICAL EXAMINATION  Oncology Vitals 12/19/2014 12/12/2014 11/24/2014 11/12/2014 11/11/2014 11/11/2014 11/11/2014  Height 168 cm - 168 cm - - - -  Weight 84.46 kg 87.091 kg 86.773 kg - - - -  Weight (lbs) 186 lbs 3 oz 192 lbs 191 lbs 5 oz - - - -  BMI (kg/m2) 30.05 kg/m2 - 30.88 kg/m2 - - - -  Temp 99 98.1 97.8 - 98.6 98.9 98.3  Pulse 62 94 61 - 65 82 93  Resp '18 18 18 ' - '18 18 20  ' SpO2 100 98 97 96 95 93 92  BSA (m2) 1.98 m2 - 2.01 m2 - - - -   BP Readings from Last 3 Encounters:  12/19/14 133/60  12/12/14 119/60  11/24/14 126/55    Physical Exam  Constitutional: He is oriented to person, place, and time and well-developed, well-nourished, and in no distress.  HENT:  Head: Normocephalic and atraumatic.  Mouth/Throat: Oropharynx is clear and moist.  Eyes: Conjunctivae and EOM are normal. Pupils are equal, round, and reactive to light. No scleral icterus.  Bilateral sclera erythema; and bilateral eyes with excessive tearing noted.  There is no purulent discharge to either eye on exam.  Neck: Normal range of motion.  Pulmonary/Chest: Effort normal. No respiratory distress.  Musculoskeletal: Normal range of motion. He exhibits no edema or tenderness.  Neurological: He is alert and oriented to person, place, and time.  Skin: Skin is warm and dry. No rash noted. There is erythema. No pallor.  Patient with red/purple erythema to the palms of his hands and the soles of feet.  There is no rash noted to general body whatsoever.  Psychiatric: Affect normal.  Nursing note and vitals reviewed.   LABORATORY DATA:. No visits with results within 3 Day(s) from this visit. Latest known visit with results is:  Admission on  11/11/2014, Discharged on 11/12/2014  Component Date Value Ref Range Status  . WBC 11/11/2014 3.8* 4.0 - 10.5 K/uL Final  . RBC 11/11/2014 4.75  4.22 - 5.81 MIL/uL Final  . Hemoglobin 11/11/2014 12.7* 13.0 - 17.0 g/dL Final  . HCT 11/11/2014 39.9  39.0 - 52.0 % Final  . MCV 11/11/2014 84.0  78.0 - 100.0 fL Final  . MCH 11/11/2014 26.7  26.0 - 34.0 pg Final  . MCHC 11/11/2014 31.8  30.0 - 36.0 g/dL Final  . RDW 11/11/2014 19.9* 11.5 - 15.5 % Final  . Platelets 11/11/2014 105* 150 - 400 K/uL Final   Comment: REPEATED TO VERIFY SPECIMEN CHECKED FOR CLOTS PLATELET COUNT CONFIRMED BY SMEAR   . Neutrophils Relative % 11/11/2014 88* 43 - 77 % Final  . Neutro Abs 11/11/2014 3.3  1.7 - 7.7 K/uL Final  . Lymphocytes Relative 11/11/2014 8* 12 - 46 % Final  . Lymphs Abs 11/11/2014 0.3* 0.7 - 4.0 K/uL Final  . Monocytes Relative 11/11/2014 4  3 - 12 % Final  . Monocytes Absolute 11/11/2014 0.2  0.1 - 1.0 K/uL Final  . Eosinophils Relative 11/11/2014 0  0 - 5 % Final  . Eosinophils Absolute 11/11/2014 0.0  0.0 - 0.7 K/uL Final  . Basophils Relative 11/11/2014 0  0 - 1 % Final  . Basophils Absolute 11/11/2014 0.0  0.0 - 0.1 K/uL Final  . Sodium 11/11/2014 132* 135 - 145 mmol/L Final  . Potassium 11/11/2014 4.2  3.5 - 5.1 mmol/L Final  . Chloride 11/11/2014 100* 101 - 111 mmol/L Final  . CO2 11/11/2014 22  22 - 32 mmol/L Final  . Glucose, Bld 11/11/2014 120* 65 - 99 mg/dL Final  . BUN 11/11/2014 27* 6 - 20 mg/dL Final  . Creatinine, Ser 11/11/2014 1.13  0.61 - 1.24 mg/dL Final  . Calcium 11/11/2014 8.6* 8.9 - 10.3 mg/dL Final  . Total Protein 11/11/2014 6.4* 6.5 - 8.1 g/dL Final  . Albumin 11/11/2014 3.5  3.5 - 5.0 g/dL Final  . AST 11/11/2014 29  15 - 41 U/L Final  . ALT 11/11/2014 26  17 - 63 U/L Final  . Alkaline Phosphatase 11/11/2014 70  38 - 126 U/L Final  . Total Bilirubin 11/11/2014 0.6  0.3 - 1.2 mg/dL Final  . GFR calc non Af Amer 11/11/2014 57* >60 mL/min Final  . GFR calc Af Amer  11/11/2014 >60  >60 mL/min Final   Comment: (NOTE) The eGFR has been calculated using the CKD EPI equation. This calculation has not been validated in all clinical situations. eGFR's persistently <60 mL/min signify possible Chronic Kidney Disease.   . Anion gap 11/11/2014 10  5 - 15 Final  . Specimen Description 11/11/2014 BLOOD LEFT HAND   Final  . Special Requests 11/11/2014 BOTTLES DRAWN AEROBIC AND ANAEROBIC 5ML   Final  . Culture 11/11/2014    Final                   Value:NO GROWTH 5 DAYS Performed at [facility]   . Report Status 11/11/2014 11/18/2014 FINAL   Final  . Specimen Description 11/11/2014 BLOOD BLOOD RIGHT FOREARM   Final  . Special Requests 11/11/2014 BOTTLES DRAWN AEROBIC AND ANAEROBIC 4L   Final  . Culture 11/11/2014    Final                   Value:NO GROWTH 5 DAYS Performed at [facility]   . Report Status 11/11/2014 11/18/2014 FINAL   Final  . Color, Urine 11/11/2014 AMBER* YELLOW Final   BIOCHEMICALS MAY BE AFFECTED BY COLOR  . APPearance 11/11/2014 CLOUDY* CLEAR Final  . Specific Gravity, Urine 11/11/2014 1.021  1.005 - 1.030 Final  . pH 11/11/2014 5.5  5.0 - 8.0 Final  . Glucose, UA 11/11/2014 NEGATIVE  NEGATIVE  mg/dL Final  . Hgb urine dipstick 11/11/2014 SMALL* NEGATIVE Final  . Bilirubin Urine 11/11/2014 NEGATIVE  NEGATIVE Final  . Ketones, ur 11/11/2014 NEGATIVE  NEGATIVE mg/dL Final  . Protein, ur 11/11/2014 30* NEGATIVE mg/dL Final  . Urobilinogen, UA 11/11/2014 0.2  0.0 - 1.0 mg/dL Final  . Nitrite 11/11/2014 NEGATIVE  NEGATIVE Final  . Leukocytes, UA 11/11/2014 NEGATIVE  NEGATIVE Final  . Specimen Description 11/11/2014 URINE, CLEAN CATCH   Final  . Special Requests 11/11/2014 NONE   Final  . Colony Count 11/11/2014    Final                   Value:NO GROWTH Performed at [facility]   . Culture 11/11/2014    Final                   Value:NO GROWTH Performed at [facility]   . Report Status 11/11/2014  11/12/2014 FINAL   Final  . Lactic Acid, Venous 11/11/2014 0.86  0.5 - 2.0 mmol/L Final  . Squamous Epithelial / LPF 11/11/2014 RARE  RARE Final  . WBC, UA 11/11/2014 0-2  <3 WBC/hpf Final  . RBC / HPF 11/11/2014 3-6  <3 RBC/hpf Final  . Bacteria, UA 11/11/2014 RARE  RARE Final  . Casts 11/11/2014 HYALINE CASTS* NEGATIVE Final  . Crystals 11/11/2014 CA OXALATE CRYSTALS* NEGATIVE Final  . Urine-Other 11/11/2014 MUCOUS PRESENT   Final     RADIOGRAPHIC STUDIES: No results found.  ASSESSMENT/PLAN:    Colon cancer Patient is status post colectomy in May 2016.  Patient initiated Xeloda oral therapy on 12/01/2014.  He reports new onset erythema to the palms of his hands and the soles of his feet; with associated tenderness and stinging/burning for the past several days.  He reports that it has become difficult to walk due to the tenderness of the soles of his feet.  He is also complaining of excessive tearing of his eyes.  He denies any purulent discharge to his eyes; and also denies any vision changes whatsoever.  He also reports that he did experience some rash/hives; which have completely resolved at this point.  Patient was instructed to take the Xeloda for 2 weeks on; with one week off.  Patient states he has only 5 doses of the Xeloda left for this weekend until he is on his one-week break.  Does appear the patient has developed some moderate hand/foot syndrome; and patient was advised to hold his last 5 doses of Xeloda to see if the hand/foot syndrome will improve.    Eye irritation Patient initiated Xeloda oral therapy on 12/01/2014.  Patient is complaining of excessive tearing to his bilateral eyes for the past several days.  He feels that his eyes are stinging.  He denies any purulent discharge or vision changes to his bilateral eyes.  On exam- Patient does appear to have sclera erythema/irritation; and excessive tearing.  There is no purulent discharge noted.  Patient continues to  deny any vision changes whatsoever.  Reviewed with patient that the Xeloda does report an approximate 13-15% risk of eye irritation.  Patient was advised to try over-the-counter eye moisturizing drops to see if this will relieve irritation.  Also suggested that the patient follow-up with an ophthalmologist for any persistent or worsening symptoms whatsoever.  Hand foot syndrome Patient is status post colectomy in May 2016.  Patient initiated Xeloda oral therapy on 12/01/2014.  He reports new onset erythema to the palms of  his hands and the soles of his feet; with associated tenderness and stinging/burning for the past several days.  He reports that it has become difficult to walk due to the tenderness of the soles of his feet.  He is also complaining of excessive tearing of his eyes.  He denies any purulent discharge to his eyes; and also denies any vision changes whatsoever.  He also reports that he did experience some rash/hives; which have completely resolved at this point.  Patient was previously instructed to take the Xeloda for 2 weeks on; with one week off.  Patient states he has only 5 doses of the Xeloda left for this weekend until he is on his one-week break.  Patient with both the palms of his hands and the soles of his feet with red/purple erythema.  It also appears that the soles of his feet are quite tender with any palpation.  Patient is finding it slightly difficult to walk due to the soreness of his feet.  Does appear the patient has developed some moderate hand/foot syndrome; and patient was advised to hold his last 5 doses of Xeloda to see if the hand/foot syndrome will improve.    Patient was also encouraged to avoid extremes in temperature to both his hands and his feet.  He was advised to keep his hands and feet well moisturized and protected as well.    Patient stated understanding of all instructions; and was in agreement with this plan of care. The patient knows to call  the clinic with any problems, questions or concerns.   Review/collaboration with Dr. Benay Spice regarding all aspects of patient's visit today.   Total time spent with patient was 25 minutes;  with greater than 75 percent of that time spent in face to face counseling regarding patient's symptoms,  and coordination of care and follow up.  Disclaimer: This note was dictated with voice recognition software. Similar sounding words can inadvertently be transcribed and may not be corrected upon review.   Drue Second, NP 12/19/2014

## 2014-12-19 NOTE — Progress Notes (Signed)
  [facility] OFFICE PROGRESS NOTE   Diagnosis: Colon cancer  INTERVAL HISTORY:   Mr. Broady begin adjuvant Xeloda on 12/01/2014. On day 11 he developed erythema and pain at the palms and soles. He also developed a rash over the forehead, mouth ulcers, and erythema/tearing of the eyes. He was seen in the symptom management clinic on 12/12/2014 and the Xeloda was placed on hold.  The hand/foot symptoms have improved. He reports 1 episode of diarrhea yesterday. He has noted buckling of the right knee on several occasions. Objective:  Vital signs in last 24 hours:  Blood pressure 133/60, pulse 62, temperature 99 F (37.2 C), temperature source Oral, resp. rate 18, height 5' 6" (1.676 m), weight 186 lb 3.2 oz (84.46 kg), SpO2 100 %.    HEENT: Mild conjunctival erythema, healing ulcers at the palate and lower lip, no thrush Resp: Lungs clear bilaterally Cardio: Regular rate and rhythm GI: No hepatomegaly, nontender Vascular: No leg edema Neurologic: Leg strength appears intact bilaterally  Skin: Mild erythematous rash at the forehead and nose, mild erythema at the fingertips and soles. No skin breakdown.     Lab Results:  Lab Results  Component Value Date   WBC 6.1 12/19/2014   HGB 12.7* 12/19/2014   HCT 39.1 12/19/2014   MCV 88.1 12/19/2014   PLT 114* 12/19/2014   NEUTROABS 3.8 12/19/2014     Imaging:  No results found.  Medications: I have reviewed the patient's current medications.  Assessment/Plan: 1. Stage III (T3, N1) adenocarcinoma of the ascending colon, status post a right colectomy 10/30/2014  1 of 19 lymph nodes positive for metastases carcinoma, lymphovascular and perineural invasion present  Normal mismatch repair protein expression  Cycle 1 adjuvant Xeloda beginning 12/01/2014, Xeloda held after day 11 secondary to mucositis/hand-foot syndrome  2. History of coronary artery disease  3. Microcytic anemia April 2016-likely iron  deficiency anemia secondary to the ascending colon tumor  4. Multiple colon polyps and a gastric polyp 10/08/2014   Disposition:  Blake Patterson developed mucositis and hand-foot syndrome with cycle 1 of adjuvant Xeloda. Xeloda remains on hold and the symptoms have partially improved. He will hold cycle 2 Xeloda and return for an office visit 12/29/2014. The plan is to dose reduce the Xeloda with cycle 2.  Betsy Coder, MD  12/19/2014  8:59 AM

## 2014-12-29 ENCOUNTER — Ambulatory Visit (HOSPITAL_BASED_OUTPATIENT_CLINIC_OR_DEPARTMENT_OTHER): Payer: Medicare Other | Admitting: Oncology

## 2014-12-29 ENCOUNTER — Encounter (HOSPITAL_COMMUNITY): Payer: Self-pay

## 2014-12-29 ENCOUNTER — Other Ambulatory Visit: Payer: Self-pay | Admitting: *Deleted

## 2014-12-29 ENCOUNTER — Emergency Department (HOSPITAL_COMMUNITY)
Admission: EM | Admit: 2014-12-29 | Discharge: 2014-12-30 | Disposition: A | Discharge status: Home / Self Care | Payer: Medicare Other | Attending: Emergency Medicine | Admitting: Emergency Medicine

## 2014-12-29 ENCOUNTER — Emergency Department (HOSPITAL_COMMUNITY): Payer: Medicare Other

## 2014-12-29 ENCOUNTER — Telehealth: Payer: Self-pay | Admitting: Oncology

## 2014-12-29 VITALS — BP 119/65 | HR 60 | Temp 98.1°F | Resp 18 | Ht 66.0 in | Wt 194.3 lb

## 2014-12-29 DIAGNOSIS — M1990 Unspecified osteoarthritis, unspecified site: Secondary | ICD-10-CM | POA: Diagnosis not present

## 2014-12-29 DIAGNOSIS — Z862 Personal history of diseases of the blood and blood-forming organs and certain disorders involving the immune mechanism: Secondary | ICD-10-CM | POA: Insufficient documentation

## 2014-12-29 DIAGNOSIS — Z87891 Personal history of nicotine dependence: Secondary | ICD-10-CM | POA: Diagnosis not present

## 2014-12-29 DIAGNOSIS — W010XXA Fall on same level from slipping, tripping and stumbling without subsequent striking against object, initial encounter: Secondary | ICD-10-CM | POA: Insufficient documentation

## 2014-12-29 DIAGNOSIS — Z86010 Personal history of colonic polyps: Secondary | ICD-10-CM | POA: Diagnosis not present

## 2014-12-29 DIAGNOSIS — Y939 Activity, unspecified: Secondary | ICD-10-CM | POA: Insufficient documentation

## 2014-12-29 DIAGNOSIS — Z7982 Long term (current) use of aspirin: Secondary | ICD-10-CM | POA: Diagnosis not present

## 2014-12-29 DIAGNOSIS — Y929 Unspecified place or not applicable: Secondary | ICD-10-CM | POA: Diagnosis not present

## 2014-12-29 DIAGNOSIS — Z79899 Other long term (current) drug therapy: Secondary | ICD-10-CM | POA: Insufficient documentation

## 2014-12-29 DIAGNOSIS — E119 Type 2 diabetes mellitus without complications: Secondary | ICD-10-CM | POA: Insufficient documentation

## 2014-12-29 DIAGNOSIS — E785 Hyperlipidemia, unspecified: Secondary | ICD-10-CM | POA: Insufficient documentation

## 2014-12-29 DIAGNOSIS — C189 Malignant neoplasm of colon, unspecified: Secondary | ICD-10-CM

## 2014-12-29 DIAGNOSIS — D509 Iron deficiency anemia, unspecified: Secondary | ICD-10-CM | POA: Diagnosis not present

## 2014-12-29 DIAGNOSIS — Y999 Unspecified external cause status: Secondary | ICD-10-CM | POA: Insufficient documentation

## 2014-12-29 DIAGNOSIS — S0181XA Laceration without foreign body of other part of head, initial encounter: Secondary | ICD-10-CM | POA: Insufficient documentation

## 2014-12-29 DIAGNOSIS — S0993XA Unspecified injury of face, initial encounter: Secondary | ICD-10-CM | POA: Diagnosis present

## 2014-12-29 DIAGNOSIS — Z85828 Personal history of other malignant neoplasm of skin: Secondary | ICD-10-CM | POA: Diagnosis not present

## 2014-12-29 DIAGNOSIS — C182 Malignant neoplasm of ascending colon: Secondary | ICD-10-CM | POA: Diagnosis present

## 2014-12-29 DIAGNOSIS — Z9889 Other specified postprocedural states: Secondary | ICD-10-CM | POA: Insufficient documentation

## 2014-12-29 DIAGNOSIS — S0083XA Contusion of other part of head, initial encounter: Secondary | ICD-10-CM

## 2014-12-29 DIAGNOSIS — I25119 Atherosclerotic heart disease of native coronary artery with unspecified angina pectoris: Secondary | ICD-10-CM | POA: Diagnosis not present

## 2014-12-29 DIAGNOSIS — W19XXXA Unspecified fall, initial encounter: Secondary | ICD-10-CM

## 2014-12-29 MED ORDER — CAPECITABINE 500 MG PO TABS: ORAL_TABLET | ORAL | Status: DC

## 2014-12-29 MED ORDER — LIDOCAINE HCL 2 % IJ SOLN
10.0000 mL | Freq: Once | INTRAMUSCULAR | Status: AC
Start: 2014-12-30 — End: 2014-12-30
  Administered 2014-12-30: 200 mg via INTRADERMAL
  Filled 2014-12-29: qty 20

## 2014-12-29 NOTE — ED Notes (Signed)
Pt fell around 6, bleeding controlled in the middle of his forehead

## 2014-12-29 NOTE — Telephone Encounter (Signed)
Gave patient relative avs report and appointments for August °

## 2014-12-29 NOTE — Progress Notes (Signed)
  [facility] OFFICE PROGRESS NOTE   Diagnosis: Colon cancer  INTERVAL HISTORY:   Mr. Tullier returns as scheduled. He feels well. Good appetite. No diarrhea. The mouth ulcers and rash have resolved. Decreased erythema at the eyes.  Objective:  Vital signs in last 24 hours:  Blood pressure 119/65, pulse 60, temperature 98.1 F (36.7 C), temperature source Oral, resp. rate 18, height $RemoveBe'5\' 6"'unwRfdfkn$  (1.676 m), weight 194 lb 4.8 oz (88.134 kg), SpO2 96 %.    HEENT: No thrush or oral ulcers. No conjunctival erythema Resp: Lungs clear bilaterally Cardio: Regular rate and rhythm GI: No hepatomegaly, nontender Vascular: No leg edema  Skin: No rash. Palms and soles without significant erythema     Lab Results:  Lab Results  Component Value Date   WBC 6.1 12/19/2014   HGB 12.7* 12/19/2014   HCT 39.1 12/19/2014   MCV 88.1 12/19/2014   PLT 114* 12/19/2014   NEUTROABS 3.8 12/19/2014    Medications: I have reviewed the patient's current medications.  Assessment/Plan: 1. Stage III (T3, N1) adenocarcinoma of the ascending colon, status post a right colectomy 10/30/2014  1 of 19 lymph nodes positive for metastases carcinoma, lymphovascular and perineural invasion present  Normal mismatch repair protein expression  Cycle 1 adjuvant Xeloda beginning 12/01/2014, Xeloda held after day 11 secondary to mucositis/hand-foot syndrome  Cycle 2 adjuvant Xeloda beginning 12/29/2014-dose reduction secondary to mucositis, conjunctivitis, and hand/foot syndrome  2. History of coronary artery disease  3. Microcytic anemia April 2016-likely iron deficiency anemia secondary to the ascending colon tumor  4. Multiple colon polyps and a gastric polyp 10/08/2014     Disposition:  Mr. Kozlov appears well today. The Xeloda toxicities have resolved. He will begin a second cycle of adjuvant Xeloda with a dose reduction today. Mr. Mccarthy will return for an office and lab visit in 3  weeks. He will discontinue Xeloda and contact us if he develops a rash, mouth sores, or diarrhea.  Betsy Coder, MD  12/29/2014  9:07 AM

## 2014-12-29 NOTE — ED Provider Notes (Signed)
Patient is a 81 year old male, had a fall just prior to arrival, struck his head on the ground causing a laceration of the forehead, broke his glasses, abraded both of his knees. On exam he is able to straight leg raise bilaterally, minimal tenderness over the abrasions but no significant tenderness, no deformity. He has a laceration to the left forehead which will need to be repaired primarily after irrigation. Otherwise no chest pain abdominal pain difficult breathing or focal neurologic deficits. Imaging pending. Update tetanus as needed.   Medical screening examination/treatment/procedure(s) were conducted as a shared visit with non-physician practitioner(s) and myself.  I personally evaluated the patient during the encounter.  Clinical Impression:   Final diagnoses:  Fall  Traumatic hematoma of forehead, initial encounter  Laceration of forehead, initial encounter         Noemi Chapel, MD 12/30/14 [phone]

## 2014-12-30 ENCOUNTER — Telehealth: Payer: Self-pay | Admitting: *Deleted

## 2014-12-30 DIAGNOSIS — S0181XA Laceration without foreign body of other part of head, initial encounter: Secondary | ICD-10-CM | POA: Diagnosis not present

## 2014-12-30 NOTE — Telephone Encounter (Signed)
VM message received from pt's wife @ 8:23 am. She states that pt fell yesterday, went to the ED and got 7 stitches to his forehead. She wants to know if it is ok to continue his Xeloda. TC back to wife. She states his knee sometimes just 'gives out' and he falls. He fell taking the trash out last evening and cut his forehead. He was seen in ED and had laceration sutured and CT Scans done. Wife states no concussion and and that he feels ok this morning. Has some brusing to his face but otherwise feels ok. Continue Xeloda?

## 2014-12-30 NOTE — Telephone Encounter (Signed)
Per Dr. Benay Spice, okay to continue Xeloda. Pt's wife voices understanding. States pt is doing well and is wearing his knee brace to prevent future falls. Appreciated call.

## 2014-12-30 NOTE — Discharge Instructions (Signed)
Sutures out in 5-7 days.  Follow up with her primary care doctor.  Use ice over the hematoma

## 2014-12-30 NOTE — ED Notes (Signed)
PA at bedside suturing up wound at this time.

## 2014-12-30 NOTE — ED Provider Notes (Signed)
CSN: [id number]     Arrival date & time 12/29/14  2106 History   First MD Initiated Contact with Patient 12/29/14 2118     Chief Complaint  Patient presents with  . Fall     (Consider location/radiation/quality/duration/timing/severity/associated sxs/prior Treatment) HPI Patient presents to the emergency department along a fall that occurred just prior to arrival.  The patient was taking out his trash cans when he tripped and fell forward on the concrete driveway.  Patient has a laceration and hematoma to the forehead.  Patient states he did not lose consciousness.  He does not have any other pain.  Patient states that he does not have any chest pain, shortness of breath, nausea, vomiting, weakness, dizziness, blurred vision, back pain, neck pain, abdominal pain or syncope.  The patient states that nothing seems make his condition better.  Palpation of the area makes the pain worse Past Medical History  Diagnosis Date  . CAD (coronary artery disease)   . Ischemic cardiomyopathy   . Dyslipidemia   . Cardiac conduction disorder   . Left ventricular apical thrombus   . Presence of stent in artery   . Arthritis   . MI (myocardial infarction)     2010  . Cancer     skin  . Diabetes mellitus without complication   . Colon polyps   . Anemia    Past Surgical History  Procedure Laterality Date  . Cardiac catheterization  12/04/2008    Proximal and Distal LAD, stented w/ a non-drug-eluting 3.5x57mm ZETA stent at 8atm, distally w/ a 3x50mm VISION stent, resulting in reduction of 100% occlusion to less than 30%.  . Cardiovascular stress test  01/13/2009    Significant perfusion seen in Apical, Basal Anterior, Basal Anteroseptal, and Apical Anterior regions-consistent w/ infarct/scar. EKG negative for ischemia. No ECG changes. No significant ischemia demonstrated.  . Transthoracic echocardiogram  03/25/2009    EF 35-45%, mild-moderate global hypokinesis  . Joint replacement      scopes bil  knees  . Total shoulder arthroplasty Right 02/20/2014    Procedure: RIGHT TOTAL SHOULDER ARTHROPLASTY;  Surgeon: Marin Shutter, MD;  Location: [facility];  Service: Orthopedics;  Laterality: Right;  . Left heart catheterization with coronary angiogram N/A 08/27/2013    Procedure: LEFT HEART CATHETERIZATION WITH CORONARY ANGIOGRAM;  Surgeon: Troy Sine, MD;  Location: [facility] CATH LAB;  Service: Cardiovascular;  Laterality: N/A;   Family History  Problem Relation Age of Onset  . Prostate cancer Father   . Esophageal cancer Neg Hx   . Colon cancer Neg Hx   . Stomach cancer Neg Hx   . Rectal cancer Neg Hx    History  Substance Use Topics  . Smoking status: Former Smoker    Quit date: 04/03/1953  . Smokeless tobacco: Never Used  . Alcohol Use: No    Review of Systems  All other systems negative except as documented in the HPI. All pertinent positives and negatives as reviewed in the HPI.  Allergies  Oxycodone  Home Medications   Prior to Admission medications   Medication Sig Start Date End Date Taking? Authorizing Provider  aspirin EC 81 MG tablet Take 81 mg by mouth every evening.   Yes Historical Provider, MD  capecitabine (XELODA) 500 MG tablet Take 2 tabs (1,000 mg) PO QAM. 1 tabs (500 mg) PO QPM for 14 days then 7 days off. 12/29/14  Yes Ladell Pier, MD  CINNAMON PO Take 1,000 mg by mouth daily.  Yes Historical Provider, MD  Coenzyme Q10 200 MG capsule Take 200 mg by mouth daily.   Yes Historical Provider, MD  famotidine (PEPCID) 40 MG tablet TAKE 1 TABLET BY MOUTH DAILY. NEED APPOINTMENT FOR FUTURE REFILLS 09/22/14  Yes Mihai Croitoru, MD  Garlic 9485 MG CAPS Take 1 capsule by mouth daily.   Yes Historical Provider, MD  Iron TABS Take 1 tablet by mouth daily.   Yes Historical Provider, MD  isosorbide mononitrate (IMDUR) 30 MG 24 hr tablet TAKE 1 TABLET BY MOUTH EVERY DAY 10/09/14  Yes Sanda Klein, MD  LIVALO 2 MG TABS Take 1 tablet by mouth every Monday, Wednesday, and  Friday.  02/26/13  Yes Historical Provider, MD  losartan (COZAAR) 25 MG tablet Take 1 tablet (25 mg total) by mouth daily. 10/21/14  Yes Mihai Croitoru, MD  metFORMIN (GLUCOPHAGE) 500 MG tablet Take 250 mg by mouth daily with breakfast.   Yes Historical Provider, MD  Multiple Vitamins-Minerals (CENTRUM SILVER ULTRA MENS PO) Take 1 tablet by mouth daily.   Yes Historical Provider, MD  NITROSTAT 0.4 MG SL tablet PLACE 1 TABLET UNDER TONUGE UP TO 3 TIMES AS NEEDED FOR CHEST PAIN 11/14/14  Yes Sanda Klein, MD  NON FORMULARY Chemo-[facility]   Yes Historical Provider, MD  Omega-3 Fatty Acids (FISH OIL) 1000 MG CAPS Take 1 capsule by mouth daily.   Yes Historical Provider, MD   BP 138/78 mmHg  Pulse 63  Temp(Src) 98.4 F (36.9 C) (Oral)  Resp 16  SpO2 94% Physical Exam  Constitutional: He is oriented to person, place, and time. He appears well-developed and well-nourished. No distress.  HENT:  Head: Normocephalic.    Right Ear: No hemotympanum.  Left Ear: No hemotympanum.  Nose: Nose normal.    Mouth/Throat: Uvula is midline, oropharynx is clear and moist and mucous membranes are normal.  Eyes: Pupils are equal, round, and reactive to light.  Neck: Normal range of motion. Neck supple.  Cardiovascular: Normal rate, regular rhythm and normal heart sounds.  Exam reveals no gallop and no friction rub.   No murmur heard. Pulmonary/Chest: Effort normal and breath sounds normal. No respiratory distress.  Neurological: He is alert and oriented to person, place, and time. He exhibits normal muscle tone. Coordination normal.  Skin: Skin is warm and dry. No erythema.  Nursing note and vitals reviewed.   ED Course  Procedures (including critical care time) Labs Review Labs Reviewed - No data to display  Imaging Review Ct Head Wo Contrast  12/29/2014   CLINICAL DATA:  Status post fall, striking face. Nasal abrasion. Concern for head or cervical spine injury. Initial encounter.  EXAM: CT HEAD WITHOUT  CONTRAST  CT MAXILLOFACIAL WITHOUT CONTRAST  CT CERVICAL SPINE WITHOUT CONTRAST  TECHNIQUE: Multidetector CT imaging of the head, cervical spine, and maxillofacial structures were performed using the standard protocol without intravenous contrast. Multiplanar CT image reconstructions of the cervical spine and maxillofacial structures were also generated.  COMPARISON:  CT of the head performed 02/18/2010, and MRI of the cervical spine performed 09/29/2008  FINDINGS: CT HEAD FINDINGS  There is no evidence of acute infarction, mass lesion, or intra- or extra-axial hemorrhage on CT.  Prominence of the ventricles and sulci reflects mild to moderate cortical volume loss. Mild cerebellar atrophy is noted.  The brainstem and fourth ventricle are within normal limits. The basal ganglia are unremarkable in appearance. The cerebral hemispheres demonstrate grossly normal gray-white differentiation. No mass effect or midline shift is seen.  There  is no evidence of fracture; visualized osseous structures are unremarkable in appearance. The visualized portions of the orbits are within normal limits. The paranasal sinuses and mastoid air cells are well-aerated. Soft tissue injury is noted overlying the left frontal calvarium, with associated soft tissue swelling extending overlying the nose.  CT MAXILLOFACIAL FINDINGS  There is no evidence of fracture or dislocation. The maxilla and mandible appear intact. The nasal bone is unremarkable in appearance. The visualized dentition demonstrates no acute abnormality.  The orbits are intact bilaterally. The visualized paranasal sinuses and mastoid air cells are well-aerated.  Mild calcification is noted at the carotid bifurcations bilaterally. Mild calcification is noted at the left palatine tonsil. The parapharyngeal fat planes are preserved. The nasopharynx, oropharynx and hypopharynx are unremarkable in appearance. The visualized portions of the valleculae and piriform sinuses are  grossly unremarkable.  The parotid and submandibular glands are within normal limits. No cervical lymphadenopathy is seen.  CT CERVICAL SPINE FINDINGS  There is no evidence of fracture or subluxation. Vertebral bodies demonstrate normal height and alignment. Prominent anterior and posterior disc osteophyte complexes are seen, with some degree of calcification along the posterior longitudinal ligament. There is mild multilevel disc space narrowing along the lower cervical spine. Underlying facet disease is noted. Prevertebral soft tissues are within normal limits.  The thyroid gland is unremarkable in appearance. The visualized lung apices are clear. No significant soft tissue abnormalities are seen.  IMPRESSION: 1. No evidence of traumatic intracranial injury or fracture. 2. No evidence of fracture or dislocation with regard to the maxillofacial structures. 3. No evidence of fracture or subluxation along the cervical spine. 4. Soft tissue swelling and injury overlying the left frontal calvarium, extending overlying the nose. 5. Mild degenerative change along the cervical spine, with some degree of calcification along the posterior longitudinal ligament. 6. Mild calcification at the carotid bifurcations bilaterally. 7. Mild calcification at the left palatine tonsil.   Electronically Signed   By: Garald Balding M.D.   On: 12/29/2014 23:34   Ct Cervical Spine Wo Contrast  12/29/2014   CLINICAL DATA:  Status post fall, striking face. Nasal abrasion. Concern for head or cervical spine injury. Initial encounter.  EXAM: CT HEAD WITHOUT CONTRAST  CT MAXILLOFACIAL WITHOUT CONTRAST  CT CERVICAL SPINE WITHOUT CONTRAST  TECHNIQUE: Multidetector CT imaging of the head, cervical spine, and maxillofacial structures were performed using the standard protocol without intravenous contrast. Multiplanar CT image reconstructions of the cervical spine and maxillofacial structures were also generated.  COMPARISON:  CT of the head  performed 02/18/2010, and MRI of the cervical spine performed 09/29/2008  FINDINGS: CT HEAD FINDINGS  There is no evidence of acute infarction, mass lesion, or intra- or extra-axial hemorrhage on CT.  Prominence of the ventricles and sulci reflects mild to moderate cortical volume loss. Mild cerebellar atrophy is noted.  The brainstem and fourth ventricle are within normal limits. The basal ganglia are unremarkable in appearance. The cerebral hemispheres demonstrate grossly normal gray-white differentiation. No mass effect or midline shift is seen.  There is no evidence of fracture; visualized osseous structures are unremarkable in appearance. The visualized portions of the orbits are within normal limits. The paranasal sinuses and mastoid air cells are well-aerated. Soft tissue injury is noted overlying the left frontal calvarium, with associated soft tissue swelling extending overlying the nose.  CT MAXILLOFACIAL FINDINGS  There is no evidence of fracture or dislocation. The maxilla and mandible appear intact. The nasal bone is unremarkable in appearance. The  visualized dentition demonstrates no acute abnormality.  The orbits are intact bilaterally. The visualized paranasal sinuses and mastoid air cells are well-aerated.  Mild calcification is noted at the carotid bifurcations bilaterally. Mild calcification is noted at the left palatine tonsil. The parapharyngeal fat planes are preserved. The nasopharynx, oropharynx and hypopharynx are unremarkable in appearance. The visualized portions of the valleculae and piriform sinuses are grossly unremarkable.  The parotid and submandibular glands are within normal limits. No cervical lymphadenopathy is seen.  CT CERVICAL SPINE FINDINGS  There is no evidence of fracture or subluxation. Vertebral bodies demonstrate normal height and alignment. Prominent anterior and posterior disc osteophyte complexes are seen, with some degree of calcification along the posterior  longitudinal ligament. There is mild multilevel disc space narrowing along the lower cervical spine. Underlying facet disease is noted. Prevertebral soft tissues are within normal limits.  The thyroid gland is unremarkable in appearance. The visualized lung apices are clear. No significant soft tissue abnormalities are seen.  IMPRESSION: 1. No evidence of traumatic intracranial injury or fracture. 2. No evidence of fracture or dislocation with regard to the maxillofacial structures. 3. No evidence of fracture or subluxation along the cervical spine. 4. Soft tissue swelling and injury overlying the left frontal calvarium, extending overlying the nose. 5. Mild degenerative change along the cervical spine, with some degree of calcification along the posterior longitudinal ligament. 6. Mild calcification at the carotid bifurcations bilaterally. 7. Mild calcification at the left palatine tonsil.   Electronically Signed   By: Garald Balding M.D.   On: 12/29/2014 23:34   Ct Maxillofacial Wo Cm  12/29/2014   CLINICAL DATA:  Status post fall, striking face. Nasal abrasion. Concern for head or cervical spine injury. Initial encounter.  EXAM: CT HEAD WITHOUT CONTRAST  CT MAXILLOFACIAL WITHOUT CONTRAST  CT CERVICAL SPINE WITHOUT CONTRAST  TECHNIQUE: Multidetector CT imaging of the head, cervical spine, and maxillofacial structures were performed using the standard protocol without intravenous contrast. Multiplanar CT image reconstructions of the cervical spine and maxillofacial structures were also generated.  COMPARISON:  CT of the head performed 02/18/2010, and MRI of the cervical spine performed 09/29/2008  FINDINGS: CT HEAD FINDINGS  There is no evidence of acute infarction, mass lesion, or intra- or extra-axial hemorrhage on CT.  Prominence of the ventricles and sulci reflects mild to moderate cortical volume loss. Mild cerebellar atrophy is noted.  The brainstem and fourth ventricle are within normal limits. The basal  ganglia are unremarkable in appearance. The cerebral hemispheres demonstrate grossly normal gray-white differentiation. No mass effect or midline shift is seen.  There is no evidence of fracture; visualized osseous structures are unremarkable in appearance. The visualized portions of the orbits are within normal limits. The paranasal sinuses and mastoid air cells are well-aerated. Soft tissue injury is noted overlying the left frontal calvarium, with associated soft tissue swelling extending overlying the nose.  CT MAXILLOFACIAL FINDINGS  There is no evidence of fracture or dislocation. The maxilla and mandible appear intact. The nasal bone is unremarkable in appearance. The visualized dentition demonstrates no acute abnormality.  The orbits are intact bilaterally. The visualized paranasal sinuses and mastoid air cells are well-aerated.  Mild calcification is noted at the carotid bifurcations bilaterally. Mild calcification is noted at the left palatine tonsil. The parapharyngeal fat planes are preserved. The nasopharynx, oropharynx and hypopharynx are unremarkable in appearance. The visualized portions of the valleculae and piriform sinuses are grossly unremarkable.  The parotid and submandibular glands are within normal limits.  No cervical lymphadenopathy is seen.  CT CERVICAL SPINE FINDINGS  There is no evidence of fracture or subluxation. Vertebral bodies demonstrate normal height and alignment. Prominent anterior and posterior disc osteophyte complexes are seen, with some degree of calcification along the posterior longitudinal ligament. There is mild multilevel disc space narrowing along the lower cervical spine. Underlying facet disease is noted. Prevertebral soft tissues are within normal limits.  The thyroid gland is unremarkable in appearance. The visualized lung apices are clear. No significant soft tissue abnormalities are seen.  IMPRESSION: 1. No evidence of traumatic intracranial injury or fracture. 2.  No evidence of fracture or dislocation with regard to the maxillofacial structures. 3. No evidence of fracture or subluxation along the cervical spine. 4. Soft tissue swelling and injury overlying the left frontal calvarium, extending overlying the nose. 5. Mild degenerative change along the cervical spine, with some degree of calcification along the posterior longitudinal ligament. 6. Mild calcification at the carotid bifurcations bilaterally. 7. Mild calcification at the left palatine tonsil.   Electronically Signed   By: Garald Balding M.D.   On: 12/29/2014 23:34   Patient is advised to use ice on the hematoma.  Told to return here as needed.  Advised follow-up spell and care doctor.  Have the sutures out in 5-7 days   LACERATION REPAIR Performed by: Brent General Authorized by: Brent General Consent: Verbal consent obtained. Risks and benefits: risks, benefits and alternatives were discussed Consent given by: patient Patient identity confirmed: provided demographic data Prepped and Draped in normal sterile fashion Wound explored  Laceration Location: Midforehead  Laceration Length: 5 cm  No Foreign Bodies seen or palpated  Anesthesia: local infiltration  Local anesthetic: lidocaine 2 % without epinephrine  Anesthetic total: 4 ml  Irrigation method: syringe Amount of cleaning: standard  Skin closure: 5-0 Prolene   Number of sutures: 7   Technique: Simple interrupted   Patient tolerance: Patient tolerated the procedure well with no immediate complications.   Dalia Heading, PA-C 12/30/14 0036  Noemi Chapel, MD 12/30/14 [phone]

## 2015-01-19 ENCOUNTER — Ambulatory Visit (HOSPITAL_BASED_OUTPATIENT_CLINIC_OR_DEPARTMENT_OTHER): Payer: Medicare Other | Admitting: Oncology

## 2015-01-19 ENCOUNTER — Other Ambulatory Visit (HOSPITAL_BASED_OUTPATIENT_CLINIC_OR_DEPARTMENT_OTHER): Payer: Medicare Other

## 2015-01-19 ENCOUNTER — Other Ambulatory Visit: Payer: Self-pay | Admitting: *Deleted

## 2015-01-19 ENCOUNTER — Telehealth: Payer: Self-pay | Admitting: Oncology

## 2015-01-19 DIAGNOSIS — C182 Malignant neoplasm of ascending colon: Secondary | ICD-10-CM

## 2015-01-19 DIAGNOSIS — D509 Iron deficiency anemia, unspecified: Secondary | ICD-10-CM

## 2015-01-19 DIAGNOSIS — C189 Malignant neoplasm of colon, unspecified: Secondary | ICD-10-CM

## 2015-01-19 LAB — CBC WITH DIFFERENTIAL/PLATELET
BASO%: 1.6 % (ref 0.0–2.0)
Basophils Absolute: 0.1 10*3/uL (ref 0.0–0.1)
EOS ABS: 0.4 10*3/uL (ref 0.0–0.5)
EOS%: 7 % (ref 0.0–7.0)
HEMATOCRIT: 42.5 % (ref 38.4–49.9)
HEMOGLOBIN: 13.8 g/dL (ref 13.0–17.1)
LYMPH#: 1.2 10*3/uL (ref 0.9–3.3)
LYMPH%: 23.5 % (ref 14.0–49.0)
MCH: 30.2 pg (ref 27.2–33.4)
MCHC: 32.4 g/dL (ref 32.0–36.0)
MCV: 93.3 fL (ref 79.3–98.0)
MONO#: 0.5 10*3/uL (ref 0.1–0.9)
MONO%: 9.5 % (ref 0.0–14.0)
NEUT#: 2.9 10*3/uL (ref 1.5–6.5)
NEUT%: 58.4 % (ref 39.0–75.0)
Platelets: 134 10*3/uL — ABNORMAL LOW (ref 140–400)
RBC: 4.55 10*6/uL (ref 4.20–5.82)
RDW: 22.7 % — AB (ref 11.0–14.6)
WBC: 5 10*3/uL (ref 4.0–10.3)

## 2015-01-19 LAB — COMPREHENSIVE METABOLIC PANEL (CC13)
ALBUMIN: 3.9 g/dL (ref 3.5–5.0)
ALT: 22 U/L (ref 0–55)
AST: 24 U/L (ref 5–34)
Alkaline Phosphatase: 68 U/L (ref 40–150)
Anion Gap: 8 mEq/L (ref 3–11)
BUN: 25.4 mg/dL (ref 7.0–26.0)
CO2: 23 meq/L (ref 22–29)
Calcium: 9.2 mg/dL (ref 8.4–10.4)
Chloride: 114 mEq/L — ABNORMAL HIGH (ref 98–109)
Creatinine: 1.1 mg/dL (ref 0.7–1.3)
EGFR: 61 mL/min/{1.73_m2} — ABNORMAL LOW (ref >90)
Glucose: 105 mg/dl (ref 70–140)
Potassium: 4.1 mEq/L (ref 3.5–5.1)
Sodium: 146 mEq/L — ABNORMAL HIGH (ref 136–145)
TOTAL PROTEIN: 6.5 g/dL (ref 6.4–8.3)
Total Bilirubin: 0.94 mg/dL (ref 0.20–1.20)

## 2015-01-19 MED ORDER — CAPECITABINE 500 MG PO TABS: ORAL_TABLET | ORAL | Status: DC

## 2015-01-19 NOTE — Telephone Encounter (Signed)
pt called to confirm appt....pt ok and aware °

## 2015-01-19 NOTE — Progress Notes (Signed)
  [place] OFFICE PROGRESS NOTE   Diagnosis: Colon cancer  INTERVAL HISTORY:   Blake Patterson returns as scheduled. He completed another cycle of Xeloda beginning 12/29/2014 . No mouth sores, diarrhea, or hand/foot pain. His feet are peeling. He fell at home when his knee gave way and developed a forehead laceration that required stitches.  Objective:  Vital signs in last 24 hours:  There were no vitals taken for this visit.    HEENT: No thrush or ulcers Resp: Lungs clear bilaterally Cardio: Regular rate and rhythm GI: No hepatomegaly, nontender Vascular: No leg edema  Skin: Dry peeling of skin at the soles, no erythema at the palms or soles, healing scar at the left forehead     Lab Results:  Lab Results  Component Value Date   WBC 5.0 01/19/2015   HGB 13.8 01/19/2015   HCT 42.5 01/19/2015   MCV 93.3 01/19/2015   PLT 134* 01/19/2015   NEUTROABS 2.9 01/19/2015      Lab Results  Component Value Date   CEA 2.4 10/09/2014   Medications: I have reviewed the patient's current medications.  Assessment/Plan: 1. Stage III (T3, N1) adenocarcinoma of the ascending colon, status post a right colectomy 10/30/2014  1 of 19 lymph nodes positive for metastases carcinoma, lymphovascular and perineural invasion present  Normal mismatch repair protein expression  Cycle 1 adjuvant Xeloda beginning 12/01/2014, Xeloda held after day 11 secondary to mucositis/hand-foot syndrome  Cycle 2 adjuvant Xeloda beginning 12/29/2014-dose reduction secondary to mucositis, conjunctivitis, and hand/foot syndrome  Cycle 3 adjuvant Xeloda beginning 01/19/2015  2. History of coronary artery disease  3. History of Microcytic anemia April 2016-likely iron deficiency anemia secondary to the ascending colon tumor  4. Multiple colon polyps and a gastric polyp 10/08/2014     Disposition:  Blake Patterson appears well today. He tolerated cycle 2 of adjuvant Xeloda with less  toxicity. He will begin cycle 3 at the same dose today. Blake Patterson will return for an office and lab visit 02/06/2015. He will discontinue Xeloda and contact us for mouth sores, diarrhea, or hand/foot pain.  Betsy Coder, MD  01/19/2015  3:35 PM

## 2015-01-29 ENCOUNTER — Other Ambulatory Visit: Payer: Medicare Other

## 2015-02-06 ENCOUNTER — Other Ambulatory Visit: Payer: Self-pay | Admitting: *Deleted

## 2015-02-06 ENCOUNTER — Telehealth: Payer: Self-pay | Admitting: Oncology

## 2015-02-06 ENCOUNTER — Ambulatory Visit (HOSPITAL_BASED_OUTPATIENT_CLINIC_OR_DEPARTMENT_OTHER): Payer: Medicare Other | Admitting: Oncology

## 2015-02-06 ENCOUNTER — Other Ambulatory Visit (HOSPITAL_BASED_OUTPATIENT_CLINIC_OR_DEPARTMENT_OTHER): Payer: Medicare Other

## 2015-02-06 VITALS — BP 110/68 | HR 66 | Temp 98.4°F | Resp 17 | Ht 66.0 in | Wt 197.5 lb

## 2015-02-06 DIAGNOSIS — C182 Malignant neoplasm of ascending colon: Secondary | ICD-10-CM

## 2015-02-06 DIAGNOSIS — D509 Iron deficiency anemia, unspecified: Secondary | ICD-10-CM

## 2015-02-06 DIAGNOSIS — C189 Malignant neoplasm of colon, unspecified: Secondary | ICD-10-CM

## 2015-02-06 LAB — COMPREHENSIVE METABOLIC PANEL (CC13)
ALT: 16 U/L (ref 0–55)
ANION GAP: 7 meq/L (ref 3–11)
AST: 21 U/L (ref 5–34)
Albumin: 3.8 g/dL (ref 3.5–5.0)
Alkaline Phosphatase: 64 U/L (ref 40–150)
BUN: 25.9 mg/dL (ref 7.0–26.0)
CALCIUM: 9.2 mg/dL (ref 8.4–10.4)
CHLORIDE: 112 meq/L — AB (ref 98–109)
CO2: 25 mEq/L (ref 22–29)
Creatinine: 1 mg/dL (ref 0.7–1.3)
EGFR: 67 mL/min/{1.73_m2} — ABNORMAL LOW (ref >90)
Glucose: 109 mg/dl (ref 70–140)
POTASSIUM: 4 meq/L (ref 3.5–5.1)
Sodium: 144 mEq/L (ref 136–145)
Total Bilirubin: 0.8 mg/dL (ref 0.20–1.20)
Total Protein: 6.3 g/dL — ABNORMAL LOW (ref 6.4–8.3)

## 2015-02-06 LAB — CBC WITH DIFFERENTIAL/PLATELET
BASO%: 1.2 % (ref 0.0–2.0)
BASOS ABS: 0.1 10*3/uL (ref 0.0–0.1)
EOS%: 6.5 % (ref 0.0–7.0)
Eosinophils Absolute: 0.3 10*3/uL (ref 0.0–0.5)
HEMATOCRIT: 39 % (ref 38.4–49.9)
HGB: 13 g/dL (ref 13.0–17.1)
LYMPH#: 1.2 10*3/uL (ref 0.9–3.3)
LYMPH%: 25.3 % (ref 14.0–49.0)
MCH: 32.5 pg (ref 27.2–33.4)
MCHC: 33.4 g/dL (ref 32.0–36.0)
MCV: 97.4 fL (ref 79.3–98.0)
MONO#: 0.4 10*3/uL (ref 0.1–0.9)
MONO%: 8.7 % (ref 0.0–14.0)
NEUT#: 2.8 10*3/uL (ref 1.5–6.5)
NEUT%: 58.3 % (ref 39.0–75.0)
Platelets: 145 10*3/uL (ref 140–400)
RBC: 4 10*6/uL — AB (ref 4.20–5.82)
RDW: 24.9 % — ABNORMAL HIGH (ref 11.0–14.6)
WBC: 4.7 10*3/uL (ref 4.0–10.3)

## 2015-02-06 MED ORDER — CAPECITABINE 500 MG PO TABS: ORAL_TABLET | ORAL | Status: DC

## 2015-02-06 NOTE — Telephone Encounter (Signed)
Pt confirmed labs/ov per 08/26 POF, gave pt AVS and Calendar... KJ °

## 2015-02-06 NOTE — Progress Notes (Signed)
  [facility] OFFICE PROGRESS NOTE   Diagnosis: Colon cancer  INTERVAL HISTORY:   Blake Patterson returns as scheduled. He completed another cycle of Xeloda beginning 01/19/2015. He reports tolerating the Xeloda well. No diarrhea, mouth sores, or hand/foot pain. He has noted mild swelling of the left foot. No pain.  Objective:  Vital signs in last 24 hours:  Blood pressure 110/68, pulse 66, temperature 98.4 F (36.9 C), temperature source Oral, resp. rate 17, height _0  (1.676 m), weight 197 lb 8 oz (89.585 kg), SpO2 94 %.    HEENT: No thrush or ulcers Resp: Lungs clear bilaterally Cardio: Regular rate and rhythm GI: No hepatomegaly, nontender Vascular: Bilateral low leg and foot varicosities, no erythema or palpable cord. The left lower leg is slightly larger than the right side with trace pretibial edema bilaterally  Skin: Mild skin thickening at the palms, dry desquamation at the heels.    Lab Results:  Lab Results  Component Value Date   WBC 4.7 02/06/2015   HGB 13.0 02/06/2015   HCT 39.0 02/06/2015   MCV 97.4 02/06/2015   PLT 145 02/06/2015   NEUTROABS 2.8 02/06/2015     Medications: I have reviewed the patient's current medications.  Assessment/Plan: 1. Stage III (T3, N1) adenocarcinoma of the ascending colon, status post a right colectomy 10/30/2014  1 of 19 lymph nodes positive for metastases carcinoma, lymphovascular and perineural invasion present  Normal mismatch repair protein expression  Cycle 1 adjuvant Xeloda beginning 12/01/2014, Xeloda held after day 11 secondary to mucositis/hand-foot syndrome  Cycle 2 adjuvant Xeloda beginning 12/29/2014-dose reduction secondary to mucositis, conjunctivitis, and hand/foot syndrome  Cycle 3 adjuvant Xeloda beginning 01/19/2015  Cycle 4 adjuvant Xeloda beginning 02/09/2015  2. History of coronary artery disease  3. History of Microcytic anemia April 2016-likely iron deficiency anemia secondary  to the ascending colon tumor  4. Multiple colon polyps and a gastric polyp 10/08/2014     Disposition:  Blake Patterson is now tolerating the Xeloda well. He will begin cycle 4 on 02/09/2015. He has bilateral lower leg varicosities. I have a low clinical suspicion for a deep vein thrombosis. He will contact us for leg pain or increased swelling.  Blake Patterson will return for an office and lab visit in 3 weeks.  Betsy Coder, MD  02/06/2015  9:14 AM

## 2015-02-27 ENCOUNTER — Telehealth: Payer: Self-pay | Admitting: Nurse Practitioner

## 2015-02-27 ENCOUNTER — Ambulatory Visit (HOSPITAL_BASED_OUTPATIENT_CLINIC_OR_DEPARTMENT_OTHER): Payer: Medicare Other | Admitting: Nurse Practitioner

## 2015-02-27 ENCOUNTER — Other Ambulatory Visit (HOSPITAL_BASED_OUTPATIENT_CLINIC_OR_DEPARTMENT_OTHER): Payer: Medicare Other

## 2015-02-27 ENCOUNTER — Other Ambulatory Visit: Payer: Self-pay | Admitting: Oncology

## 2015-02-27 VITALS — BP 125/61 | HR 60 | Temp 98.3°F | Resp 17

## 2015-02-27 DIAGNOSIS — D509 Iron deficiency anemia, unspecified: Secondary | ICD-10-CM | POA: Diagnosis not present

## 2015-02-27 DIAGNOSIS — C182 Malignant neoplasm of ascending colon: Secondary | ICD-10-CM | POA: Diagnosis present

## 2015-02-27 DIAGNOSIS — C189 Malignant neoplasm of colon, unspecified: Secondary | ICD-10-CM

## 2015-02-27 LAB — COMPREHENSIVE METABOLIC PANEL (CC13)
ALT: 13 U/L (ref 0–55)
AST: 19 U/L (ref 5–34)
Albumin: 4 g/dL (ref 3.5–5.0)
Alkaline Phosphatase: 64 U/L (ref 40–150)
Anion Gap: 8 mEq/L (ref 3–11)
BUN: 23.6 mg/dL (ref 7.0–26.0)
CHLORIDE: 111 meq/L — AB (ref 98–109)
CO2: 23 meq/L (ref 22–29)
Calcium: 9.5 mg/dL (ref 8.4–10.4)
Creatinine: 1.1 mg/dL (ref 0.7–1.3)
EGFR: 59 mL/min/{1.73_m2} — AB (ref >90)
GLUCOSE: 119 mg/dL (ref 70–140)
Potassium: 4.3 mEq/L (ref 3.5–5.1)
SODIUM: 142 meq/L (ref 136–145)
Total Bilirubin: 0.72 mg/dL (ref 0.20–1.20)
Total Protein: 6.4 g/dL (ref 6.4–8.3)

## 2015-02-27 LAB — CBC WITH DIFFERENTIAL/PLATELET
BASO%: 1.2 % (ref 0.0–2.0)
BASOS ABS: 0.1 10*3/uL (ref 0.0–0.1)
EOS ABS: 0.3 10*3/uL (ref 0.0–0.5)
EOS%: 6.9 % (ref 0.0–7.0)
HCT: 40.6 % (ref 38.4–49.9)
HEMOGLOBIN: 13.5 g/dL (ref 13.0–17.1)
LYMPH%: 27.1 % (ref 14.0–49.0)
MCH: 33.8 pg — ABNORMAL HIGH (ref 27.2–33.4)
MCHC: 33.2 g/dL (ref 32.0–36.0)
MCV: 101.8 fL — AB (ref 79.3–98.0)
MONO#: 0.4 10*3/uL (ref 0.1–0.9)
MONO%: 9.1 % (ref 0.0–14.0)
NEUT#: 2.4 10*3/uL (ref 1.5–6.5)
NEUT%: 55.7 % (ref 39.0–75.0)
Platelets: 126 10*3/uL — ABNORMAL LOW (ref 140–400)
RBC: 3.99 10*6/uL — AB (ref 4.20–5.82)
RDW: 23.2 % — AB (ref 11.0–14.6)
WBC: 4.4 10*3/uL (ref 4.0–10.3)
lymph#: 1.2 10*3/uL (ref 0.9–3.3)

## 2015-02-27 MED ORDER — CAPECITABINE 500 MG PO TABS: ORAL_TABLET | ORAL | Status: DC

## 2015-02-27 NOTE — Telephone Encounter (Signed)
Pt confirmed labs/ov per 09/16 POF, gave pt AVS and Calendar... KJ °

## 2015-02-27 NOTE — Progress Notes (Signed)
  [facility] OFFICE PROGRESS NOTE   Diagnosis:  Colon cancer  INTERVAL HISTORY:   Mr. Elizondo returns as scheduled. He completed cycle 4 adjuvant Xeloda beginning 02/09/2015. He denies nausea/vomiting. No mouth sores. No diarrhea. No hand or foot pain or redness. No abdominal pain. He has a good appetite. No rash. He continues to note stable bilateral lower leg edema. He denies shortness of breath. No significant weight change.  Objective:  Vital signs in last 24 hours:  Blood pressure 125/61, pulse 60, temperature 98.3 F (36.8 C), temperature source Oral, resp. rate 17, SpO2 96 %.    HEENT: No thrush or ulcers. Resp: Lungs clear bilaterally. No wheezes or rales. Cardio: Regular rate and rhythm. GI: Abdomen soft and nontender. No hepatomegaly. Vascular: Trace lower leg edema bilaterally. Right lower leg is slightly larger than the left lower leg. Calves soft and nontender.  Skin: Palms with mild skin thickening, mild erythema. No skin breakdown.    Lab Results:  Lab Results  Component Value Date   WBC 4.4 02/27/2015   HGB 13.5 02/27/2015   HCT 40.6 02/27/2015   MCV 101.8* 02/27/2015   PLT 126* 02/27/2015   NEUTROABS 2.4 02/27/2015    Imaging:  No results found.  Medications: I have reviewed the patient's current medications.  Assessment/Plan: 1. Stage III (T3, N1) adenocarcinoma of the ascending colon, status post a right colectomy 10/30/2014  1 of 19 lymph nodes positive for metastases carcinoma, lymphovascular and perineural invasion present  Normal mismatch repair protein expression  Cycle 1 adjuvant Xeloda beginning 12/01/2014, Xeloda held after day 11 secondary to mucositis/hand-foot syndrome  Cycle 2 adjuvant Xeloda beginning 12/29/2014-dose reduction secondary to mucositis, conjunctivitis, and hand/foot syndrome  Cycle 3 adjuvant Xeloda beginning 01/19/2015  Cycle 4 adjuvant Xeloda beginning 02/09/2015  Cycle 5 adjuvant Xeloda beginning  03/02/2015  2. History of coronary artery disease  3. History of Microcytic anemia April 2016-likely iron deficiency anemia secondary to the ascending colon tumor  4. Multiple colon polyps and a gastric polyp 10/08/2014   Disposition: Blake Patterson appears stable. He has completed 4 cycles of adjuvant Xeloda. Plan to proceed with cycle 5 as scheduled beginning 03/02/2015. He will return for a follow-up visit in 3 weeks. He will contact the office in the interim with any problems. We specifically discussed increased leg edema and/or pain.    Ned Card ANP/GNP-BC   02/27/2015  9:03 AM

## 2015-03-20 ENCOUNTER — Other Ambulatory Visit: Payer: Self-pay | Admitting: Oncology

## 2015-03-20 ENCOUNTER — Telehealth: Payer: Self-pay | Admitting: Oncology

## 2015-03-20 ENCOUNTER — Other Ambulatory Visit (HOSPITAL_BASED_OUTPATIENT_CLINIC_OR_DEPARTMENT_OTHER): Payer: Medicare Other

## 2015-03-20 ENCOUNTER — Ambulatory Visit (HOSPITAL_BASED_OUTPATIENT_CLINIC_OR_DEPARTMENT_OTHER): Payer: Medicare Other | Admitting: Nurse Practitioner

## 2015-03-20 VITALS — BP 124/58 | HR 96 | Temp 97.8°F | Resp 17 | Ht 66.0 in | Wt 200.3 lb

## 2015-03-20 DIAGNOSIS — C189 Malignant neoplasm of colon, unspecified: Secondary | ICD-10-CM

## 2015-03-20 DIAGNOSIS — C182 Malignant neoplasm of ascending colon: Secondary | ICD-10-CM | POA: Diagnosis present

## 2015-03-20 DIAGNOSIS — D509 Iron deficiency anemia, unspecified: Secondary | ICD-10-CM

## 2015-03-20 LAB — CBC WITH DIFFERENTIAL/PLATELET
BASO%: 0.9 % (ref 0.0–2.0)
BASOS ABS: 0 10*3/uL (ref 0.0–0.1)
EOS%: 6.3 % (ref 0.0–7.0)
Eosinophils Absolute: 0.3 10*3/uL (ref 0.0–0.5)
HEMATOCRIT: 41.1 % (ref 38.4–49.9)
HEMOGLOBIN: 13.6 g/dL (ref 13.0–17.1)
LYMPH#: 1.1 10*3/uL (ref 0.9–3.3)
LYMPH%: 22.7 % (ref 14.0–49.0)
MCH: 35 pg — AB (ref 27.2–33.4)
MCHC: 33.1 g/dL (ref 32.0–36.0)
MCV: 105.9 fL — ABNORMAL HIGH (ref 79.3–98.0)
MONO#: 0.5 10*3/uL (ref 0.1–0.9)
MONO%: 9.5 % (ref 0.0–14.0)
NEUT#: 3 10*3/uL (ref 1.5–6.5)
NEUT%: 60.6 % (ref 39.0–75.0)
Platelets: 142 10*3/uL (ref 140–400)
RBC: 3.88 10*6/uL — ABNORMAL LOW (ref 4.20–5.82)
RDW: 20.2 % — AB (ref 11.0–14.6)
WBC: 5 10*3/uL (ref 4.0–10.3)

## 2015-03-20 LAB — COMPREHENSIVE METABOLIC PANEL (CC13)
ALBUMIN: 3.9 g/dL (ref 3.5–5.0)
ALK PHOS: 87 U/L (ref 40–150)
ALT: 13 U/L (ref 0–55)
AST: 20 U/L (ref 5–34)
Anion Gap: 6 mEq/L (ref 3–11)
BUN: 23.8 mg/dL (ref 7.0–26.0)
CALCIUM: 9.4 mg/dL (ref 8.4–10.4)
CO2: 23 mEq/L (ref 22–29)
CREATININE: 1.3 mg/dL (ref 0.7–1.3)
Chloride: 115 mEq/L — ABNORMAL HIGH (ref 98–109)
EGFR: 52 mL/min/{1.73_m2} — ABNORMAL LOW (ref >90)
Glucose: 101 mg/dl (ref 70–140)
Potassium: 4.4 mEq/L (ref 3.5–5.1)
Sodium: 145 mEq/L (ref 136–145)
Total Bilirubin: 0.53 mg/dL (ref 0.20–1.20)
Total Protein: 6.5 g/dL (ref 6.4–8.3)

## 2015-03-20 MED ORDER — CAPECITABINE 500 MG PO TABS: ORAL_TABLET | ORAL | Status: DC

## 2015-03-20 NOTE — Telephone Encounter (Signed)
Gave and printed appt sched and avs fo rpt for OCT °

## 2015-03-20 NOTE — Progress Notes (Signed)
  [facility] OFFICE PROGRESS NOTE   Diagnosis:  Colon cancer  INTERVAL HISTORY:   Mr. Hagedorn returns as scheduled. He completed cycle 5 adjuvant Xeloda beginning 03/02/2015. He denies nausea/vomiting. No mouth sores. No diarrhea. He notes that his hands and feet are red. No associated pain or skin breakdown.  Objective:  Vital signs in last 24 hours:  Blood pressure 124/58, pulse 96, temperature 97.8 F (36.6 C), temperature source Oral, resp. rate 17, height $RemoveBe'5\' 6"'ZhfdcoZWE$  (1.676 m), weight 200 lb 4.8 oz (90.855 kg), SpO2 96 %.    HEENT: No thrush or ulcers. Resp: Lungs clear bilaterally. Cardio: Regular rate and rhythm. GI: Abdomen soft and nontender. No hepatomegaly. Vascular: Trace lower leg edema bilaterally. Right lower leg is slightly larger than the left lower leg. Skin: Palms and soles with significant erythema. No skin breakdown. Nontender.    Lab Results:  Lab Results  Component Value Date   WBC 5.0 03/20/2015   HGB 13.6 03/20/2015   HCT 41.1 03/20/2015   MCV 105.9* 03/20/2015   PLT 142 03/20/2015   NEUTROABS 3.0 03/20/2015    Imaging:  No results found.  Medications: I have reviewed the patient's current medications.  Assessment/Plan: 1. Stage III (T3, N1) adenocarcinoma of the ascending colon, status post a right colectomy 10/30/2014  1 of 19 lymph nodes positive for metastases carcinoma, lymphovascular and perineural invasion present  Normal mismatch repair protein expression  Cycle 1 adjuvant Xeloda beginning 12/01/2014, Xeloda held after day 11 secondary to mucositis/hand-foot syndrome  Cycle 2 adjuvant Xeloda beginning 12/29/2014-dose reduction secondary to mucositis, conjunctivitis, and hand/foot syndrome  Cycle 3 adjuvant Xeloda beginning 01/19/2015  Cycle 4 adjuvant Xeloda beginning 02/09/2015  Cycle 5 adjuvant Xeloda beginning 03/02/2015  2. History of coronary artery disease  3. History of Microcytic anemia April  2016-likely iron deficiency anemia secondary to the ascending colon tumor  4. Multiple colon polyps and a gastric polyp 10/08/2014   Disposition: Mr. Yost has completed 5 cycles of adjuvant Xeloda. Palms and soles are markedly erythematous at today's visit. He is scheduled to begin cycle 6 on 03/23/2015. We will delay for one week. He will begin cycle 6 on 03/30/2015 if the erythema is better and he has developed no other symptoms of hand-foot syndrome. He will return for a follow-up visit on 04/08/2015. He will contact the office in the interim with any problems.  Plan reviewed with Dr. Benay Spice.    Ned Card ANP/GNP-BC   03/20/2015  11:44 AM

## 2015-03-30 ENCOUNTER — Encounter: Payer: Self-pay | Admitting: *Deleted

## 2015-03-30 NOTE — Progress Notes (Signed)
  Oncology Nurse Navigator Documentation    Navigator Encounter Type: Other (03/30/15 1643):  Sent patient MyChart message since I was not able to reach him by phone.   Treatment Phase: Treatment (03/30/15 1643): Inquired if his hands and feet have improved enough to resume his Xeloda today?

## 2015-04-08 ENCOUNTER — Other Ambulatory Visit: Payer: Self-pay | Admitting: Oncology

## 2015-04-08 ENCOUNTER — Ambulatory Visit (HOSPITAL_BASED_OUTPATIENT_CLINIC_OR_DEPARTMENT_OTHER): Payer: Medicare Other | Admitting: Nurse Practitioner

## 2015-04-08 ENCOUNTER — Telehealth: Payer: Self-pay | Admitting: Oncology

## 2015-04-08 ENCOUNTER — Encounter: Payer: Self-pay | Admitting: *Deleted

## 2015-04-08 ENCOUNTER — Other Ambulatory Visit (HOSPITAL_BASED_OUTPATIENT_CLINIC_OR_DEPARTMENT_OTHER): Payer: Medicare Other

## 2015-04-08 VITALS — BP 115/80 | HR 62 | Temp 97.5°F | Resp 18 | Ht 66.0 in | Wt 200.7 lb

## 2015-04-08 DIAGNOSIS — D509 Iron deficiency anemia, unspecified: Secondary | ICD-10-CM | POA: Diagnosis not present

## 2015-04-08 DIAGNOSIS — C189 Malignant neoplasm of colon, unspecified: Secondary | ICD-10-CM

## 2015-04-08 DIAGNOSIS — C182 Malignant neoplasm of ascending colon: Secondary | ICD-10-CM

## 2015-04-08 LAB — CBC WITH DIFFERENTIAL/PLATELET
BASO%: 1 % (ref 0.0–2.0)
Basophils Absolute: 0 10*3/uL (ref 0.0–0.1)
EOS%: 3.7 % (ref 0.0–7.0)
Eosinophils Absolute: 0.2 10*3/uL (ref 0.0–0.5)
HCT: 44.8 % (ref 38.4–49.9)
HEMOGLOBIN: 14.7 g/dL (ref 13.0–17.1)
LYMPH%: 31.5 % (ref 14.0–49.0)
MCH: 34.9 pg — ABNORMAL HIGH (ref 27.2–33.4)
MCHC: 32.8 g/dL (ref 32.0–36.0)
MCV: 106.4 fL — ABNORMAL HIGH (ref 79.3–98.0)
MONO#: 0.4 10*3/uL (ref 0.1–0.9)
MONO%: 10 % (ref 0.0–14.0)
NEUT%: 53.8 % (ref 39.0–75.0)
NEUTROS ABS: 2.2 10*3/uL (ref 1.5–6.5)
Platelets: 118 10*3/uL — ABNORMAL LOW (ref 140–400)
RBC: 4.21 10*6/uL (ref 4.20–5.82)
RDW: 15.3 % — AB (ref 11.0–14.6)
WBC: 4.1 10*3/uL (ref 4.0–10.3)
lymph#: 1.3 10*3/uL (ref 0.9–3.3)

## 2015-04-08 LAB — COMPREHENSIVE METABOLIC PANEL (CC13)
ALT: 15 U/L (ref 0–55)
AST: 19 U/L (ref 5–34)
Albumin: 4.1 g/dL (ref 3.5–5.0)
Alkaline Phosphatase: 93 U/L (ref 40–150)
Anion Gap: 7 mEq/L (ref 3–11)
BILIRUBIN TOTAL: 0.78 mg/dL (ref 0.20–1.20)
BUN: 23.5 mg/dL (ref 7.0–26.0)
CO2: 24 meq/L (ref 22–29)
CREATININE: 1.1 mg/dL (ref 0.7–1.3)
Calcium: 9.7 mg/dL (ref 8.4–10.4)
Chloride: 113 mEq/L — ABNORMAL HIGH (ref 98–109)
EGFR: 63 mL/min/{1.73_m2} — AB (ref >90)
GLUCOSE: 128 mg/dL (ref 70–140)
Potassium: 4.2 mEq/L (ref 3.5–5.1)
SODIUM: 144 meq/L (ref 136–145)
TOTAL PROTEIN: 6.8 g/dL (ref 6.4–8.3)

## 2015-04-08 MED ORDER — CAPECITABINE 500 MG PO TABS: ORAL_TABLET | ORAL | Status: DC

## 2015-04-08 NOTE — Telephone Encounter (Signed)
Gave adn pritned appt sched and avs for pt for NOV and DEC  °

## 2015-04-08 NOTE — Progress Notes (Signed)
Oncology Nurse Navigator Documentation  Oncology Nurse Navigator Flowsheets 04/08/2015  Referral date to RadOnc/MedOnc -  Navigator Encounter Type Routine F/U  Patient Visit Type Medonc  Treatment Phase Treatment w/Xeloda  Barriers/Navigation Needs No barriers at this time  Interventions None required  Support Groups/Services -  Time Spent with Patient 15  Reports he is getting his medication without difficulty and has no co pay.

## 2015-04-08 NOTE — Progress Notes (Signed)
  [facility] OFFICE PROGRESS NOTE   Diagnosis:  Colon cancer  INTERVAL HISTORY:   Blake Patterson returns as scheduled. He began cycle 6 adjuvant Xeloda beginning 03/30/2015. Cycle 6 was delayed one week due to progressive hand-foot syndrome following cycle 5. He denies nausea/vomiting. No mouth sores. No diarrhea. No hand or foot pain or redness. He reports a good appetite.  Objective:  Vital signs in last 24 hours:  Blood pressure 115/80, pulse 62, temperature 97.5 F (36.4 C), temperature source Oral, resp. rate 18, height $RemoveBe'5\' 6"'JlmtXDloZ$  (1.676 m), weight 200 lb 11.2 oz (91.037 kg), SpO2 98 %.    HEENT: No thrush or ulcers. Left lower lateral eyelid margin with a small white raised lesion. Resp: Lungs clear bilaterally. Cardio: Regular rate and rhythm. GI: Abdomen soft and nontender. No hepatomegaly. Vascular: No leg edema. Skin: Palms and soles with skin thickening, mild erythema. No skin breakdown.    Lab Results:  Lab Results  Component Value Date   WBC 4.1 04/08/2015   HGB 14.7 04/08/2015   HCT 44.8 04/08/2015   MCV 106.4* 04/08/2015   PLT 118* 04/08/2015   NEUTROABS 2.2 04/08/2015    Imaging:  No results found.  Medications: I have reviewed the patient's current medications.  Assessment/Plan: 1. Stage III (T3, N1) adenocarcinoma of the ascending colon, status post a right colectomy 10/30/2014  1 of 19 lymph nodes positive for metastases carcinoma, lymphovascular and perineural invasion present  Normal mismatch repair protein expression  Cycle 1 adjuvant Xeloda beginning 12/01/2014, Xeloda held after day 11 secondary to mucositis/hand-foot syndrome  Cycle 2 adjuvant Xeloda beginning 12/29/2014-dose reduction secondary to mucositis, conjunctivitis, and hand/foot syndrome  Cycle 3 adjuvant Xeloda beginning 01/19/2015  Cycle 4 adjuvant Xeloda beginning 02/09/2015  Cycle 5 adjuvant Xeloda beginning 03/02/2015  Cycle 6 adjuvant Xeloda beginning  03/30/2015  2. History of coronary artery disease  3. History of Microcytic anemia April 2016-likely iron deficiency anemia secondary to the ascending colon tumor  4. Multiple colon polyps and a gastric polyp 10/08/2014    Disposition: Mr. Sellin appears stable. He is completing cycle 6 adjuvant Xeloda. Symptoms of hand-foot syndrome are better. He will begin cycle 7 adjuvant Xeloda on 04/20/2015. We will see him in follow-up on 05/05/2015. He will contact the office in the interim with any problems. We specifically discussed hand-foot symptoms.  He has an appointment with his eye doctor tomorrow to evaluate the small lesion at the lower lid margin.  Ned Card ANP/GNP-BC   04/08/2015  9:10 AM

## 2015-05-05 ENCOUNTER — Ambulatory Visit (HOSPITAL_BASED_OUTPATIENT_CLINIC_OR_DEPARTMENT_OTHER): Payer: Medicare Other | Admitting: Nurse Practitioner

## 2015-05-05 ENCOUNTER — Other Ambulatory Visit (HOSPITAL_BASED_OUTPATIENT_CLINIC_OR_DEPARTMENT_OTHER): Payer: Medicare Other

## 2015-05-05 VITALS — BP 121/51 | HR 51 | Temp 97.5°F | Resp 17 | Ht 66.0 in | Wt 200.7 lb

## 2015-05-05 DIAGNOSIS — L271 Localized skin eruption due to drugs and medicaments taken internally: Secondary | ICD-10-CM

## 2015-05-05 DIAGNOSIS — I255 Ischemic cardiomyopathy: Secondary | ICD-10-CM | POA: Diagnosis not present

## 2015-05-05 DIAGNOSIS — C189 Malignant neoplasm of colon, unspecified: Secondary | ICD-10-CM | POA: Diagnosis present

## 2015-05-05 DIAGNOSIS — G620 Drug-induced polyneuropathy: Secondary | ICD-10-CM | POA: Diagnosis not present

## 2015-05-05 DIAGNOSIS — T451X5A Adverse effect of antineoplastic and immunosuppressive drugs, initial encounter: Secondary | ICD-10-CM

## 2015-05-05 LAB — CBC WITH DIFFERENTIAL/PLATELET
BASO%: 1.1 % (ref 0.0–2.0)
BASOS ABS: 0.1 10*3/uL (ref 0.0–0.1)
EOS%: 5.8 % (ref 0.0–7.0)
Eosinophils Absolute: 0.3 10*3/uL (ref 0.0–0.5)
HEMATOCRIT: 42.5 % (ref 38.4–49.9)
HGB: 13.8 g/dL (ref 13.0–17.1)
LYMPH#: 1.3 10*3/uL (ref 0.9–3.3)
LYMPH%: 28.9 % (ref 14.0–49.0)
MCH: 34.7 pg — AB (ref 27.2–33.4)
MCHC: 32.5 g/dL (ref 32.0–36.0)
MCV: 106.8 fL — ABNORMAL HIGH (ref 79.3–98.0)
MONO#: 0.4 10*3/uL (ref 0.1–0.9)
MONO%: 8.6 % (ref 0.0–14.0)
NEUT#: 2.6 10*3/uL (ref 1.5–6.5)
NEUT%: 55.6 % (ref 39.0–75.0)
Platelets: 121 10*3/uL — ABNORMAL LOW (ref 140–400)
RBC: 3.98 10*6/uL — AB (ref 4.20–5.82)
RDW: 15.6 % — ABNORMAL HIGH (ref 11.0–14.6)
WBC: 4.6 10*3/uL (ref 4.0–10.3)

## 2015-05-05 LAB — COMPREHENSIVE METABOLIC PANEL (CC13)
ALT: 19 U/L (ref 0–55)
AST: 24 U/L (ref 5–34)
Albumin: 3.9 g/dL (ref 3.5–5.0)
Alkaline Phosphatase: 72 U/L (ref 40–150)
Anion Gap: 8 mEq/L (ref 3–11)
BUN: 27.3 mg/dL — AB (ref 7.0–26.0)
CALCIUM: 9.2 mg/dL (ref 8.4–10.4)
CHLORIDE: 111 meq/L — AB (ref 98–109)
CO2: 23 meq/L (ref 22–29)
Creatinine: 1.1 mg/dL (ref 0.7–1.3)
EGFR: 63 mL/min/{1.73_m2} — ABNORMAL LOW (ref >90)
GLUCOSE: 111 mg/dL (ref 70–140)
POTASSIUM: 4.1 meq/L (ref 3.5–5.1)
Sodium: 142 mEq/L (ref 136–145)
Total Bilirubin: 0.66 mg/dL (ref 0.20–1.20)
Total Protein: 6.4 g/dL (ref 6.4–8.3)

## 2015-05-05 MED ORDER — CAPECITABINE 500 MG PO TABS: ORAL_TABLET | ORAL | Status: DC

## 2015-05-06 ENCOUNTER — Encounter: Payer: Self-pay | Admitting: Nurse Practitioner

## 2015-05-06 DIAGNOSIS — G620 Drug-induced polyneuropathy: Secondary | ICD-10-CM | POA: Insufficient documentation

## 2015-05-06 DIAGNOSIS — T451X5A Adverse effect of antineoplastic and immunosuppressive drugs, initial encounter: Secondary | ICD-10-CM

## 2015-05-06 NOTE — Progress Notes (Signed)
SYMPTOM MANAGEMENT CLINIC   HPI: Blake Patterson 81 y.o. male diagnosed with colon cancer.  Currently undergoing Xeloda oral therapy.  Patient just completed cycle 7 of his Xeloda oral therapy recently; is currently in the midst of his "off week".  He reports that his hand/foot syndrome is slowly resolving; and he continues with only some very mild neuropathy to his feet only.  He denies any other new symptoms whatsoever.  He denies any recent fevers or chills.  Blood counts obtained today revealed WBC 4.6, ANC 2.6, hemoglobin 13.8, and platelet count 121.  He denies any worsening issues with either easy bleeding or bruising.  The plan is for the patient to return on the super 12th 2016 for labs and a follow-up visit.  Also, a refill for patient's Xeloda oral therapy has been called in to the [facility], per patient's request.  HPI  ROS  Past Medical History  Diagnosis Date  . CAD (coronary artery disease)   . Ischemic cardiomyopathy   . Dyslipidemia   . Cardiac conduction disorder   . Left ventricular apical thrombus ([place])   . Presence of stent in artery   . Arthritis   . MI (myocardial infarction) ([place])     2010  . Cancer ([place])     skin  . Diabetes mellitus without complication ([place])   . Colon polyps   . Anemia     Past Surgical History  Procedure Laterality Date  . Cardiac catheterization  12/04/2008    Proximal and Distal LAD, stented w/ a non-drug-eluting 3.5x15m ZETA stent at 8atm, distally w/ a 3x129mVISION stent, resulting in reduction of 100% occlusion to less than 30%.  . Cardiovascular stress test  01/13/2009    Significant perfusion seen in Apical, Basal Anterior, Basal Anteroseptal, and Apical Anterior regions-consistent w/ infarct/scar. EKG negative for ischemia. No ECG changes. No significant ischemia demonstrated.  . Transthoracic echocardiogram  03/25/2009    EF 35-45%, mild-moderate global hypokinesis  . Joint replacement      scopes  bil knees  . Total shoulder arthroplasty Right 02/20/2014    Procedure: RIGHT TOTAL SHOULDER ARTHROPLASTY;  Surgeon: KeMarin ShutterMD;  [facility];  Laterality: Right;  . Left heart catheterization with coronary angiogram N/A 08/27/2013    Procedure: LEFT HEART CATHETERIZATION WITH CORONARY ANGIOGRAM;  Surgeon: ThTroy SineMD;  [facility];  Service: Cardiovascular;  Laterality: N/A;    has CAD (coronary artery disease); Cardiomyopathy, ischemic; Hyperlipidemia; Intermediate coronary syndrome ([facility] Unstable angina ([facility] S/P shoulder replacement; Anemia, iron deficiency; Colon cancer (HCS[place] Hand foot syndrome; Eye irritation; and Neuropathy due to chemotherapeutic drug ([place] his problem list.    is allergic to oxycodone.    Medication List       This list is accurate as of: 05/05/15 11:59 PM.  Always use your most recent med list.               aspirin EC 81 MG tablet  Take 81 mg by mouth every evening.     capecitabine 500 MG tablet  Commonly known as:  XELODA  Take 2 tabs (1,000 mg) PO QAM. 1 tabs (500 mg) PO QPM for 14 days then 7 days off.     CINNAMON PO  Take 1,000 mg by mouth daily.     Coenzyme Q10 200 MG capsule  Take 200 mg by mouth daily.     famotidine 40 MG tablet  Commonly  known as:  PEPCID  TAKE 1 TABLET BY MOUTH DAILY. NEED APPOINTMENT FOR FUTURE REFILLS     Fish Oil 1000 MG Caps  Take 1 capsule by mouth daily.     Garlic 0354 MG Caps  Take 1 capsule by mouth daily.     Iron Tabs  Take 1 tablet by mouth daily.     isosorbide mononitrate 30 MG 24 hr tablet  Commonly known as:  IMDUR  TAKE 1 TABLET BY MOUTH EVERY DAY     LIVALO 2 MG Tabs  Generic drug:  Pitavastatin Calcium  Take 1 tablet by mouth every Monday, Wednesday, and Friday.     losartan 25 MG tablet  Commonly known as:  COZAAR  Take 1 tablet (25 mg total) by mouth daily.     metFORMIN 500 MG tablet  Commonly known as:  GLUCOPHAGE  Take  250 mg by mouth daily with breakfast.     NITROSTAT 0.4 MG SL tablet  Generic drug:  nitroGLYCERIN  PLACE 1 TABLET UNDER TONUGE UP TO 3 TIMES AS NEEDED FOR CHEST PAIN     NON FORMULARY  Chemo-[facility]         PHYSICAL EXAMINATION  Oncology Vitals 05/05/2015 04/08/2015  Height 168 cm 168 cm  Weight 91.037 kg 91.037 kg  Weight (lbs) 200 lbs 11 oz 200 lbs 11 oz  BMI (kg/m2) 32.39 kg/m2 32.39 kg/m2  Temp 97.5 97.5  Pulse 51 62  Resp 17 18  SpO2 98 98  BSA (m2) 2.06 m2 2.06 m2   BP Readings from Last 2 Encounters:  05/05/15 121/51  04/08/15 115/80    Physical Exam  Constitutional: He is oriented to person, place, and time and well-developed, well-nourished, and in no distress.  HENT:  Head: Normocephalic and atraumatic.  Mouth/Throat: Oropharynx is clear and moist.  Eyes: Conjunctivae and EOM are normal. Pupils are equal, round, and reactive to light. Right eye exhibits no discharge. Left eye exhibits no discharge. No scleral icterus.  Patient has an area of mild erythema to the corner of his left lower eyelid as baseline.  Patient states that he has been to see his eye doctor recently; and  The eye doctor.  Advised just to monitor the eye for the time being.  Neck: Normal range of motion. Neck supple. No JVD present. No tracheal deviation present. No thyromegaly present.  Cardiovascular: Normal rate, regular rhythm, normal heart sounds and intact distal pulses.   Pulmonary/Chest: Effort normal and breath sounds normal. No respiratory distress. He has no wheezes. He has no rales. He exhibits no tenderness.  Abdominal: Soft. Bowel sounds are normal. He exhibits no distension and no mass. There is no tenderness. There is no rebound and no guarding.  Musculoskeletal: Normal range of motion. He exhibits no edema or tenderness.  Lymphadenopathy:    He has no cervical adenopathy.  Neurological: He is alert and oriented to person, place, and time. Gait normal.  Skin: Skin is warm and dry.  No rash noted. There is erythema. No pallor.  Trace erythema to the palms of the hands only.  Psychiatric: Affect normal.  Nursing note and vitals reviewed.   LABORATORY DATA:. Appointment on 05/05/2015  Component Date Value Ref Range Status  . WBC 05/05/2015 4.6  4.0 - 10.3 10e3/uL Final  . NEUT# 05/05/2015 2.6  1.5 - 6.5 10e3/uL Final  . HGB 05/05/2015 13.8  13.0 - 17.1 g/dL Final  . HCT 05/05/2015 42.5  38.4 - 49.9 % Final  .  Platelets 05/05/2015 121* 140 - 400 10e3/uL Final  . MCV 05/05/2015 106.8* 79.3 - 98.0 fL Final  . MCH 05/05/2015 34.7* 27.2 - 33.4 pg Final  . MCHC 05/05/2015 32.5  32.0 - 36.0 g/dL Final  . RBC 05/05/2015 3.98* 4.20 - 5.82 10e6/uL Final  . RDW 05/05/2015 15.6* 11.0 - 14.6 % Final  . lymph# 05/05/2015 1.3  0.9 - 3.3 10e3/uL Final  . MONO# 05/05/2015 0.4  0.1 - 0.9 10e3/uL Final  . Eosinophils Absolute 05/05/2015 0.3  0.0 - 0.5 10e3/uL Final  . Basophils Absolute 05/05/2015 0.1  0.0 - 0.1 10e3/uL Final  . NEUT% 05/05/2015 55.6  39.0 - 75.0 % Final  . LYMPH% 05/05/2015 28.9  14.0 - 49.0 % Final  . MONO% 05/05/2015 8.6  0.0 - 14.0 % Final  . EOS% 05/05/2015 5.8  0.0 - 7.0 % Final  . BASO% 05/05/2015 1.1  0.0 - 2.0 % Final  . Sodium 05/05/2015 142  136 - 145 mEq/L Final  . Potassium 05/05/2015 4.1  3.5 - 5.1 mEq/L Final  . Chloride 05/05/2015 111* 98 - 109 mEq/L Final  . CO2 05/05/2015 23  22 - 29 mEq/L Final  . Glucose 05/05/2015 111  70 - 140 mg/dl Final   Glucose reference range is for nonfasting patients. Fasting glucose reference range is 70- 100.  Marland Kitchen BUN 05/05/2015 27.3* 7.0 - 26.0 mg/dL Final  . Creatinine 05/05/2015 1.1  0.7 - 1.3 mg/dL Final  . Total Bilirubin 05/05/2015 0.66  0.20 - 1.20 mg/dL Final  . Alkaline Phosphatase 05/05/2015 72  40 - 150 U/L Final  . AST 05/05/2015 24  5 - 34 U/L Final  . ALT 05/05/2015 19  0 - 55 U/L Final  . Total Protein 05/05/2015 6.4  6.4 - 8.3 g/dL Final  . Albumin 05/05/2015 3.9  3.5 - 5.0 g/dL Final  . Calcium  05/05/2015 9.2  8.4 - 10.4 mg/dL Final  . Anion Gap 05/05/2015 8  3 - 11 mEq/L Final  . EGFR 05/05/2015 63* >90 ml/min/1.73 m2 Final   eGFR is calculated using the CKD-EPI Creatinine Equation (2009)     RADIOGRAPHIC STUDIES: No results found.  ASSESSMENT/PLAN:    Colon cancer Patient just completed cycle 7 of his Xeloda oral therapy recently; is currently in the midst of his "off week".  He reports that his hand/foot syndrome is slowly resolving; and he continues with only some very mild neuropathy to his feet only.  He denies any other new symptoms whatsoever.  He denies any recent fevers or chills.  Blood counts obtained today revealed WBC 4.6, ANC 2.6, hemoglobin 13.8, and platelet count 121.  He denies any worsening issues with either easy bleeding or bruising.  The plan is for the patient to return on the super 12th 2016 for labs and a follow-up visit.  Also, a refill for patient's Xeloda oral therapy has been called in to the [facility], per patient's request.  Hand foot syndrome Patient reports that his mild hand/foot syndrome is slowly resolving.  On exam.-Patient has only mild, trace of erythema to the palms of his bilateral hands only.  There are no areas of cracking or nail changes.  Patient was reminded to avoid extremes in temperature and to keep his hands well moisturized.    Neuropathy due to chemotherapeutic drug [facility]) Patient reports some very mild neuropathy to his feet only secondary to the Xeloda oral therapy.  Will continue to monitor closely.  Patient stated  understanding of all instructions; and was in agreement with this plan of care. The patient knows to call the clinic with any problems, questions or concerns.   Review/collaboration with Dr. Benay Spice regarding all aspects of patient's visit today.   Total time spent with patient was 25 minutes;  with greater than 75 percent of that time spent in face to face counseling regarding  patient's symptoms,  and coordination of care and follow up.  Disclaimer:This dictation was prepared with Dragon/digital dictation along with [employer]. Any transcriptional errors that result from this process are unintentional.  Drue Second, NP 05/06/2015

## 2015-05-06 NOTE — Assessment & Plan Note (Signed)
Patient reports some very mild neuropathy to his feet only secondary to the Xeloda oral therapy.  Will continue to monitor closely.

## 2015-05-06 NOTE — Assessment & Plan Note (Signed)
Patient reports that his mild hand/foot syndrome is slowly resolving.  On exam.-Patient has only mild, trace of erythema to the palms of his bilateral hands only.  There are no areas of cracking or nail changes.  Patient was reminded to avoid extremes in temperature and to keep his hands well moisturized.

## 2015-05-06 NOTE — Assessment & Plan Note (Signed)
Patient just completed cycle 7 of his Xeloda oral therapy recently; is currently in the midst of his "off week".  He reports that his hand/foot syndrome is slowly resolving; and he continues with only some very mild neuropathy to his feet only.  He denies any other new symptoms whatsoever.  He denies any recent fevers or chills.  Blood counts obtained today revealed WBC 4.6, ANC 2.6, hemoglobin 13.8, and platelet count 121.  He denies any worsening issues with either easy bleeding or bruising.  The plan is for the patient to return on the super 12th 2016 for labs and a follow-up visit.  Also, a refill for patient's Xeloda oral therapy has been called in to the [facility], per patient's request.

## 2015-05-25 ENCOUNTER — Ambulatory Visit (HOSPITAL_COMMUNITY)
Admission: RE | Admit: 2015-05-25 | Discharge: 2015-05-25 | Disposition: A | Discharge status: Home / Self Care | Payer: Medicare Other | Source: Ambulatory Visit | Attending: Oncology | Admitting: Oncology

## 2015-05-25 ENCOUNTER — Telehealth: Payer: Self-pay | Admitting: *Deleted

## 2015-05-25 ENCOUNTER — Ambulatory Visit (HOSPITAL_BASED_OUTPATIENT_CLINIC_OR_DEPARTMENT_OTHER): Payer: Medicare Other | Admitting: Oncology

## 2015-05-25 ENCOUNTER — Other Ambulatory Visit (HOSPITAL_BASED_OUTPATIENT_CLINIC_OR_DEPARTMENT_OTHER): Payer: Medicare Other

## 2015-05-25 ENCOUNTER — Telehealth: Payer: Self-pay | Admitting: Oncology

## 2015-05-25 VITALS — BP 101/52 | HR 82 | Temp 98.2°F | Resp 18 | Ht 66.0 in | Wt 197.4 lb

## 2015-05-25 DIAGNOSIS — C182 Malignant neoplasm of ascending colon: Secondary | ICD-10-CM

## 2015-05-25 DIAGNOSIS — C189 Malignant neoplasm of colon, unspecified: Secondary | ICD-10-CM

## 2015-05-25 DIAGNOSIS — R221 Localized swelling, mass and lump, neck: Secondary | ICD-10-CM

## 2015-05-25 DIAGNOSIS — D509 Iron deficiency anemia, unspecified: Secondary | ICD-10-CM

## 2015-05-25 LAB — CBC WITH DIFFERENTIAL/PLATELET
BASO%: 0.7 % (ref 0.0–2.0)
BASOS ABS: 0.1 10*3/uL (ref 0.0–0.1)
EOS ABS: 0.1 10*3/uL (ref 0.0–0.5)
EOS%: 1.5 % (ref 0.0–7.0)
HCT: 43.1 % (ref 38.4–49.9)
HEMOGLOBIN: 14.1 g/dL (ref 13.0–17.1)
LYMPH%: 12.9 % — ABNORMAL LOW (ref 14.0–49.0)
MCH: 34.2 pg — AB (ref 27.2–33.4)
MCHC: 32.7 g/dL (ref 32.0–36.0)
MCV: 104.4 fL — AB (ref 79.3–98.0)
MONO#: 0.9 10*3/uL (ref 0.1–0.9)
MONO%: 10.9 % (ref 0.0–14.0)
NEUT#: 5.9 10*3/uL (ref 1.5–6.5)
NEUT%: 74 % (ref 39.0–75.0)
Platelets: 150 10*3/uL (ref 140–400)
RBC: 4.13 10*6/uL — ABNORMAL LOW (ref 4.20–5.82)
RDW: 17.6 % — AB (ref 11.0–14.6)
WBC: 7.9 10*3/uL (ref 4.0–10.3)
lymph#: 1 10*3/uL (ref 0.9–3.3)

## 2015-05-25 LAB — COMPREHENSIVE METABOLIC PANEL
ALT: 14 U/L (ref 0–55)
AST: 18 U/L (ref 5–34)
Albumin: 3.6 g/dL (ref 3.5–5.0)
Alkaline Phosphatase: 71 U/L (ref 40–150)
Anion Gap: 10 mEq/L (ref 3–11)
BUN: 22.3 mg/dL (ref 7.0–26.0)
CHLORIDE: 110 meq/L — AB (ref 98–109)
CO2: 21 meq/L — AB (ref 22–29)
Calcium: 9.5 mg/dL (ref 8.4–10.4)
Creatinine: 1.2 mg/dL (ref 0.7–1.3)
EGFR: 54 mL/min/{1.73_m2} — AB (ref >90)
GLUCOSE: 126 mg/dL (ref 70–140)
POTASSIUM: 4.3 meq/L (ref 3.5–5.1)
SODIUM: 141 meq/L (ref 136–145)
Total Bilirubin: 0.65 mg/dL (ref 0.20–1.20)
Total Protein: 7 g/dL (ref 6.4–8.3)

## 2015-05-25 MED ORDER — CEPHALEXIN 500 MG PO CAPS: 500.0000 mg | ORAL_CAPSULE | Freq: Three times a day (TID) | ORAL | Status: AC

## 2015-05-25 NOTE — Telephone Encounter (Signed)
Patient sent to [facility] for ct and given avs report and appointments for January and appointments Dr. Janace Hoard at [facility] ENT 12/15 @ 10 am to arrive 9:40 am. Fax coversheet with along with copy of Referral to HIM to send note to [facility] ENT at [phone].

## 2015-05-25 NOTE — Progress Notes (Addendum)
  [facility] OFFICE PROGRESS NOTE   Diagnosis:  Colon cancer  INTERVAL HISTORY:    Blake Patterson returns as scheduled. He completed cycle 8 of adjuvant Xeloda beginning 05/11/2015. No mouth sores, diarrhea, or hand/foot pain. He has developed a "knot " in the right neck for the past one week. There is mild associated pain. Pain is relieved with aspirin.  Objective:  Vital signs in last 24 hours:  Blood pressure 101/52, pulse 82, temperature 98.2 F (36.8 C), temperature source Oral, resp. rate 18, height $RemoveBe'5\' 6"'EuDZJrApJ$  (1.676 m), weight 197 lb 6.4 oz (89.54 kg), SpO2 98 %.    HEENT:  Oropharynx without thrush or ulcers.  Oropharynx without visible mass. There is an approximate  4-5 centimeter masslike area in the right neck inferior and posterior to the parotid.  mild associated tenderness. Mild overlying erythema. No fluctuance. Lymphatics:  No cervical, supraclavicular , axillary, or inguinal nodes Resp:  Lungs clear bilaterally Cardio:  Regular rate and rhythm GI:  No hepatosplenomegaly , nontender, no mass Vascular:  No leg edema  Skin: mild erythema of the palms and soles without skin breakdown.     Lab Results:  Lab Results  Component Value Date   WBC 7.9 05/25/2015   HGB 14.1 05/25/2015   HCT 43.1 05/25/2015   MCV 104.4* 05/25/2015   PLT 150 05/25/2015   NEUTROABS 5.9 05/25/2015      Lab Results  Component Value Date   CEA 2.4 10/09/2014    Medications: I have reviewed the patient's current medications.  Assessment/Plan: 1. Stage III (T3, N1) adenocarcinoma of the ascending colon, status post a right colectomy 10/30/2014  1 of 19 lymph nodes positive for metastases carcinoma, lymphovascular and perineural invasion present  Normal mismatch repair protein expression  Cycle 1 adjuvant Xeloda beginning 12/01/2014, Xeloda held after day 11 secondary to mucositis/hand-foot syndrome  Cycle 2 adjuvant Xeloda beginning 12/29/2014-dose reduction secondary to  mucositis, conjunctivitis, and hand/foot syndrome  Cycle 3 adjuvant Xeloda beginning 01/19/2015  Cycle 4 adjuvant Xeloda beginning 02/09/2015  Cycle 5 adjuvant Xeloda beginning 03/02/2015  Cycle 6 adjuvant Xeloda beginning 03/30/2015   cycle 7 adjuvant Xeloda beginning  04/20/2015   Cycle 8 adjuvant Xeloda beginning 05/11/2015  2. History of coronary artery disease  3. History of Microcytic anemia April 2016-likely iron deficiency anemia secondary to the ascending colon tumor  4. Multiple colon polyps and a gastric polyp 10/08/2014     Disposition:   Mr. Klee has completed adjuvant chemotherapy. We will follow-up on the CEA from today.   he has a painful mass in the right upper neck near the inferior tip of the parotid. This may be related to an infection or malignancy. I will contact ENT to see if they can evaluate him within the next few days. He will be placed on an  Him.. Antibiotic and referred for a neck CT based on the ENT discussion.   Mr. Otero will return for an office visit here in approximately one month. Betsy Coder, MD  05/25/2015  3:33 PM    I discussed the case with Dr. Janace Hoard. He recommends a course of antibiotics and holding on the CT for now. He will see Mr. Ake within the next one week.

## 2015-05-25 NOTE — Telephone Encounter (Signed)
Left message on voicemail on for pt. Cancel CT. Dr. Benay Spice discussed case with Dr. Janace Hoard, ENT. Plan is for antibiotics for one week and evaluation by Dr. Janace Hoard. Spoke with [facility] in radiology. CT canceled.

## 2015-05-26 ENCOUNTER — Telehealth: Payer: Self-pay | Admitting: Oncology

## 2015-05-26 LAB — CEA

## 2015-05-26 NOTE — Telephone Encounter (Signed)
FAXED OVER OFFICE NOTE TO DR. BUYERS (RELEASE ID [id number])

## 2015-06-10 ENCOUNTER — Other Ambulatory Visit: Payer: Self-pay | Admitting: Otolaryngology

## 2015-06-19 ENCOUNTER — Other Ambulatory Visit: Payer: Self-pay | Admitting: *Deleted

## 2015-06-25 ENCOUNTER — Encounter: Payer: Self-pay | Admitting: Nurse Practitioner

## 2015-06-25 ENCOUNTER — Encounter: Payer: Self-pay | Admitting: Oncology

## 2015-06-25 ENCOUNTER — Telehealth: Payer: Self-pay | Admitting: Oncology

## 2015-06-25 ENCOUNTER — Ambulatory Visit (HOSPITAL_BASED_OUTPATIENT_CLINIC_OR_DEPARTMENT_OTHER): Payer: Medicare Other | Admitting: Nurse Practitioner

## 2015-06-25 VITALS — BP 113/56 | HR 72 | Temp 98.1°F | Resp 18 | Ht 66.0 in | Wt 201.6 lb

## 2015-06-25 DIAGNOSIS — Z23 Encounter for immunization: Secondary | ICD-10-CM

## 2015-06-25 DIAGNOSIS — R221 Localized swelling, mass and lump, neck: Secondary | ICD-10-CM | POA: Diagnosis not present

## 2015-06-25 DIAGNOSIS — D509 Iron deficiency anemia, unspecified: Secondary | ICD-10-CM | POA: Diagnosis not present

## 2015-06-25 DIAGNOSIS — Z85038 Personal history of other malignant neoplasm of large intestine: Secondary | ICD-10-CM | POA: Diagnosis present

## 2015-06-25 DIAGNOSIS — C182 Malignant neoplasm of ascending colon: Secondary | ICD-10-CM

## 2015-06-25 MED ORDER — INFLUENZA VAC SPLIT QUAD 0.5 ML IM SUSY
0.5000 mL | PREFILLED_SYRINGE | Freq: Once | INTRAMUSCULAR | Status: AC
  Administered 2015-06-25: 0.5 mL via INTRAMUSCULAR
  Filled 2015-06-25: qty 0.5

## 2015-06-25 NOTE — Telephone Encounter (Signed)
Scheduled patient per pof, avs report printed.  °

## 2015-06-25 NOTE — Progress Notes (Signed)
Patient and his spouse came in office to request itemized statements for cancer policy. Balance here shows 0. Called Patient Accounting at [phone]) to have them send to patient. Patient's spouse provided email address to have them send to. Patient accounting states she should receive them by the end of the day today or tomorrow. Also asked about hospital billing. Gave them the number of [phone] to request and see if they can email as well.Pt has my card for any additional financial questions or concerns. Pt and spouse very appreciative.

## 2015-06-25 NOTE — Progress Notes (Addendum)
  [facility] OFFICE PROGRESS NOTE   Diagnosis:   Colon cancer  INTERVAL HISTORY:   Mr. Rockhill returns as scheduled. He feels well. He thinks the neck mass has nearly resolved. He noted significant improvement upon beginning antibiotics. He denies nausea/vomiting. No mouth sores. No diarrhea.  Objective:  Vital signs in last 24 hours:  Blood pressure 113/56, pulse 72, temperature 98.1 F (36.7 C), temperature source Oral, resp. rate 18, height _0  (1.676 m), weight 201 lb 9.6 oz (91.445 kg), SpO2 94 %.    HEENT: No thrush or ulcers. Slight fullness/firmness right neck just below the angle of the jaw. No discrete mass. Resp: Lungs clear bilaterally. Cardio: Regular rate and rhythm. GI: Abdomen soft and nontender. No hepatomegaly. Vascular: No leg edema. Skin: Palms with skin thickening. No erythema or skin breakdown.    Lab Results:  Lab Results  Component Value Date   WBC 7.9 05/25/2015   HGB 14.1 05/25/2015   HCT 43.1 05/25/2015   MCV 104.4* 05/25/2015   PLT 150 05/25/2015   NEUTROABS 5.9 05/25/2015    Imaging:  No results found.  Medications: I have reviewed the patient's current medications.  Assessment/Plan: 1. Stage III (T3, N1) adenocarcinoma of the ascending colon, status post a right colectomy 10/30/2014  1 of 19 lymph nodes positive for metastases carcinoma, lymphovascular and perineural invasion present  Normal mismatch repair protein expression  Cycle 1 adjuvant Xeloda beginning 12/01/2014, Xeloda held after day 11 secondary to mucositis/hand-foot syndrome  Cycle 2 adjuvant Xeloda beginning 12/29/2014-dose reduction secondary to mucositis, conjunctivitis, and hand/foot syndrome  Cycle 3 adjuvant Xeloda beginning 01/19/2015  Cycle 4 adjuvant Xeloda beginning 02/09/2015  Cycle 5 adjuvant Xeloda beginning 03/02/2015  Cycle 6 adjuvant Xeloda beginning 03/30/2015  cycle 7 adjuvant Xeloda beginning 04/20/2015  Cycle 8 adjuvant  Xeloda beginning 05/11/2015  2. History of coronary artery disease  3. History of Microcytic anemia April 2016-likely iron deficiency anemia secondary to the ascending colon tumor  4. Multiple colon polyps and a gastric polyp 10/08/2014  5.   Painful mass right upper neck 05/25/2015. He completed a course of cephalexin. He was referred to Dr. Janace Hoard, ENT. Fine-needle aspiration 06/10/2015 showed benign adipose tissue and abundant blood. No lymphoid cells or epithelial cells. No malignant cells. Significantly improved 06/25/2015.   Disposition: Mr. Pargas is in clinical remission from colon cancer. We scheduled a return visit in 4 months with a CEA.  The right neck mass has nearly resolved. He has follow-up with Dr. Janace Hoard later this month.  Patient seen with Dr. Benay Spice.    Ned Card ANP/GNP-BC   06/25/2015  11:52 AM  This was a shared visit with Ned Card. Mr. Cohen was interviewed and examined. The masslike fullness near the right parotid has almost completely resolved.  Mr. Cosman remains in clinical remission from colon cancer.  Julieanne Manson, M.D.

## 2015-08-12 ENCOUNTER — Encounter: Payer: Self-pay | Admitting: Gastroenterology

## 2015-08-25 ENCOUNTER — Emergency Department (HOSPITAL_COMMUNITY)
Admission: EM | Admit: 2015-08-25 | Discharge: 2015-09-12 | Disposition: E | Payer: Medicare Other | Attending: Emergency Medicine | Admitting: Emergency Medicine

## 2015-08-25 DIAGNOSIS — I469 Cardiac arrest, cause unspecified: Secondary | ICD-10-CM | POA: Diagnosis not present

## 2015-08-25 DIAGNOSIS — E119 Type 2 diabetes mellitus without complications: Secondary | ICD-10-CM | POA: Insufficient documentation

## 2015-08-25 DIAGNOSIS — I252 Old myocardial infarction: Secondary | ICD-10-CM | POA: Diagnosis not present

## 2015-08-25 DIAGNOSIS — I2510 Atherosclerotic heart disease of native coronary artery without angina pectoris: Secondary | ICD-10-CM | POA: Diagnosis not present

## 2015-08-25 LAB — PREPARE FRESH FROZEN PLASMA
UNIT DIVISION: 0
Unit division: 0

## 2015-09-12 NOTE — ED Notes (Signed)
[employer] CSI at bedside processing pt.

## 2015-09-12 NOTE — Progress Notes (Signed)
   09/14/15 2200  Clinical Encounter Type  Visited With Patient;Family;Patient and family together  Visit Type Initial;Psychological support;Spiritual support;Social support;Code;Critical Care;Death;ED;Trauma  Referral From Nurse;Physician;Care management  Consult/Referral To Physician;Palliative care  Spiritual Encounters  Spiritual Needs Prayer;Emotional  Stress Factors  Patient Stress Factors Not reviewed  Family Stress Factors Exhausted;Health changes;Loss   Chaplain responded to level 1 trauma/CPR. Chaplain was with Dr when the Doctor updated family. Chaplain gave emotional care via prayer.

## 2015-09-12 NOTE — ED Notes (Signed)
Bedside ultrasound shows no cardiac activity

## 2015-09-12 NOTE — ED Provider Notes (Signed)
Pt seen on arrival EMS reports pt found down outside with large head wound No other details are known He was pulseless/cold with large head wound Time of death at September 29, 2138 D/w ME Jeanette Caprice about this patient   CPR Procedure Note I PERSONALLY DIRECTED ANCILLARY STAFF OR/PERFORMED CPR IN AN EFFORT TO REGAIN RETURN OF SPONTANEOUS CIRCULATION IN AN EFFORT TO MAINTAIN NEURO, CARDIAC AND SYSTEMIC PERFUSION   Ripley Fraise, MD 09-10-2015 09-28-2152

## 2015-09-12 NOTE — ED Notes (Signed)
Family is here and being placed in consultation room, chaplain paged

## 2015-09-12 NOTE — ED Notes (Signed)
All belongings given to family. There is no wallet on patient confirmed with [employer]. Jeans, underwear, socks, and shoes in [employer] custody. Family is aware that patient is ME case and is not viewable.

## 2015-09-12 NOTE — ED Provider Notes (Signed)
CSN: [id number]     Arrival date & time 08-31-15  09-18-2145 History   First MD Initiated Contact with Patient 31-Aug-2015 09/19/47     Chief Complaint  Patient presents with  . Cardiac Arrest      HPI Patient presenting after being found down in his front yard by his neighbor. Patient was pulseless on EMS arrival. It was unknown how long he was down on the ground. EMS intubated the patient's in the field and he was brought here for evaluation. Patient's wife reports that the patient did not have any symptoms throughout the day today and has been doing well.  No past medical history on file. No past surgical history on file. No family history on file. Social History  Substance Use Topics  . Smoking status: Not on file  . Smokeless tobacco: Not on file  . Alcohol Use: Not on file   OB History    No data available     Review of Systems  Unable to perform ROS: Patient unresponsive      Allergies  Review of patient's allergies indicates not on file.  Home Medications   Prior to Admission medications   Not on File   Pulse 20  Resp 0 Physical Exam  Constitutional:  Mottled, cold  HENT:  Large v-shaped laceration to the forehead. 8.0 ETT in place.  Eyes:  Pupils 54mm, non-reactive bilaterally  Neck: No tracheal deviation present.  Cardiovascular:  No palpable pulses  Pulmonary/Chest:  Bilateral breath sounds with BVM  Abdominal: Soft. He exhibits no distension.  Musculoskeletal:  No gross deformities  Neurological:  Unresponsive, GCS 3  Skin:  Cold, mottled    ED Course  Procedures  Direct laryngoscopy performed with MAC 4 blade to ensure proper ETT placement. 8.0 ETT visualized passing through the focal cords.   Labs Review Labs Reviewed  TYPE AND SCREEN  PREPARE FRESH FROZEN PLASMA    Imaging Review No results found. I have personally reviewed and evaluated these images and lab results as part of my medical decision-making.   EKG Interpretation None       MDM   Final diagnoses:  Cardiac arrest [facility])    EMS was called after the patient was found down outside by his wife with a large head injury. Down time is unknown the patient was cold on EMS arrival. Patient was found to be in PEA. CPR was initiated. On rhythm check patient was found to be in V. fib arrest. He was shocked 3, then obtained ROSC at 09-18-12. At 18-Sep-2113 he went back into PEA. An IO was placed in the right proximal tibia and an epinephrine infusion was started. CPR was continued en route to [facility].   On arrival the patient was noted to be cold and mottled. Pupils 3 mm and nonreactive bilaterally. Rhythm strip revealed PEA. No cardiac activity on bedside US. Direct laryngoscopy performed to confirm tube placement. After assessment by the trauma team and emergency medicine team at bedside, the decision was made to stop resuscitative efforts. Patient pronounced dead at 09-19-2138. Family updated regarding events. ME notified.   Jenifer Algernon Huxley, MD 2015/08/31 2356  Ripley Fraise, MD 08/26/15 09/19/02

## 2015-09-12 NOTE — ED Notes (Signed)
Belongings: gold colored watch, nitro bottle, and set of car keys

## 2015-09-12 NOTE — ED Notes (Signed)
On arrival to department CPR was continued by EMS for 2 minutes. CPR stopped at 2136/10/09. Pulses checked and patient placed on monitor. CPR discontinued per EDP and Trauma MD. Time of death 10-Oct-2138. Assessment completed by EDP after time of death to include bedside ultrasound, removal of LSB, and dressing placed on avulsion to forehead.

## 2015-09-12 NOTE — ED Notes (Signed)
EMS was called by wife after wife found patient outside. Patient was unresponsive on EMS arrival, apneic in PEA. Patient with avulsion to anterior forehead, large amount of bleeding. CPR was initiated on scene at 2039. Patient went into vfib and was shocked x 3. Pulses regained in brady at 2107. At 2115 patient with no pulses and CPR initiated. Patient was given 7 epis, 2 amios, and an epi drip started en route.

## 2015-09-12 NOTE — Progress Notes (Signed)
RT responded to level 1 trauma/CPR.  Assisted MD with verifying tube placement. Code was called.

## 2015-09-12 NOTE — ED Notes (Addendum)
[facility] notified - Blake Patterson [phone] . Post mortem care rendered , EDP advised RN that pt. will be a medical examiner case.

## 2015-09-12 DEATH — deceased

## 2015-10-02 ENCOUNTER — Ambulatory Visit: Payer: Medicare Other | Admitting: Gastroenterology

## 2015-10-26 ENCOUNTER — Other Ambulatory Visit: Payer: Medicare Other

## 2015-10-26 ENCOUNTER — Ambulatory Visit: Payer: Medicare Other | Admitting: Oncology

## 2016-01-09 IMAGING — CT CT CHEST W/ CM
2 of 3 series · 15 of 30 positions shown, 17 images · IV contrast (75CC ISOVUE 300)
Comparison: CT abdomen and pelvis 10/13/2014

ADDENDUM:
Incorrect report template was utilized.

Study should be amended to reflect the correct procedure performed:
CT CHEST, ABDOMEN, AND PELVIS WITH CONTRAST:
Multidetector helical CT imaging of the chest, abdomen, and pelvis
was performed following administration of IV contrast and dilute
oral contrast. Sagittal and coronal MPR images reconstructed from
axial data set.
CLINICAL DATA: New diagnosis of colon cancer question pulmonary
metastases ; surgery 10/30/2014 (robotic RIGHT hemicolectomy), past
history coronary artery disease post MI, ischemic cardiomyopathy,
dyslipidemia, diabetes mellitus, former smoker
EXAM:
CT CHEST WITH CONTRAST
TECHNIQUE: Multidetector CT imaging of the chest was performed during
intravenous contrast administration. Sagittal and coronal MPR images
reconstructed from axial data set.
CONTRAST:  75mL 1XPRYX-ZWW IOPAMIDOL (1XPRYX-ZWW) INJECTION 61% IV

[Series 3: chest with · axial · 0.70mm/px · z∈[-234,+26]mm · 7 of 70 slices shown, 9 images]
[im 9/70  mediastinal]
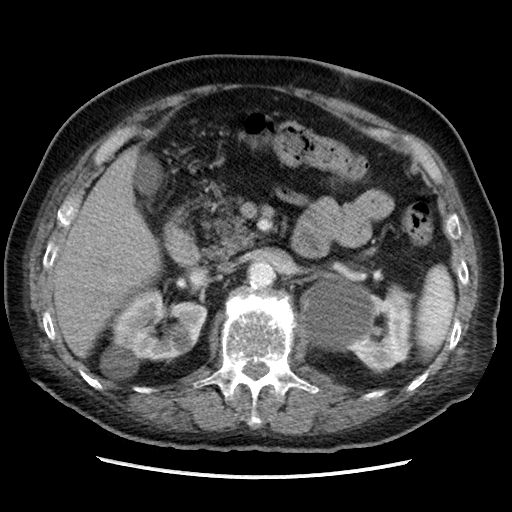
[im 9/70  lung]
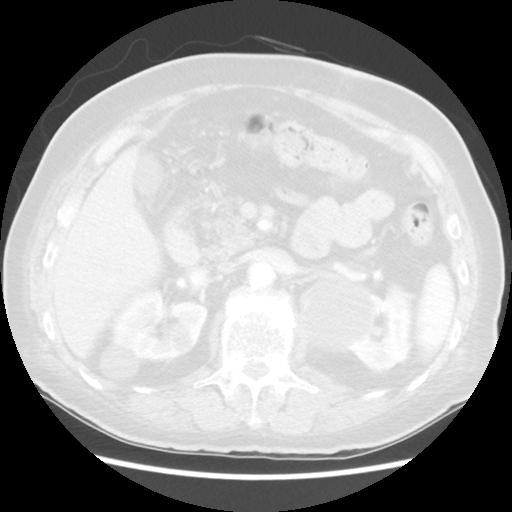
[im 18/70  lung]
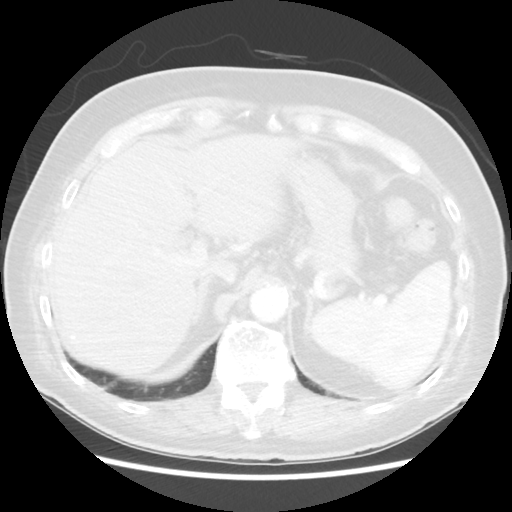
[im 26/70  lung]
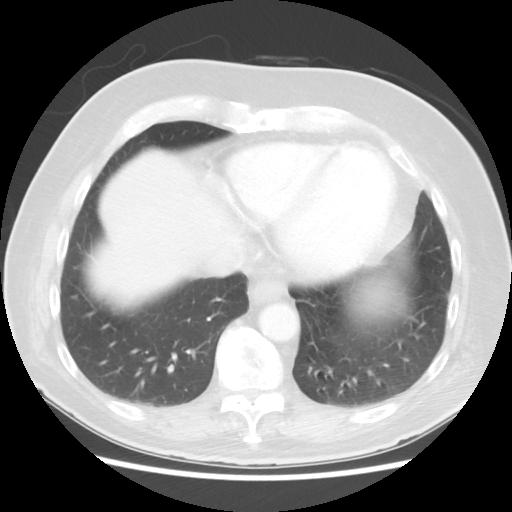
[im 35/70  lung]
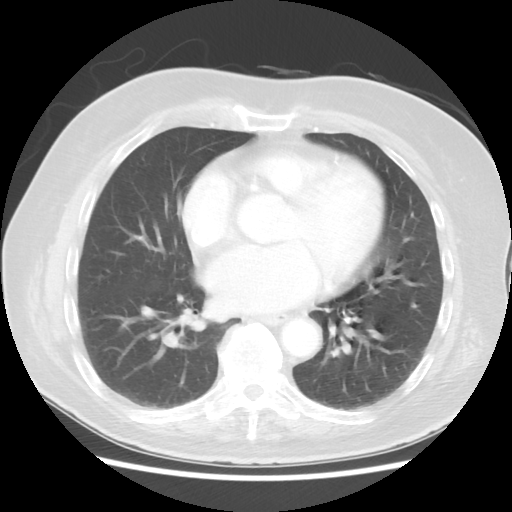
[im 44/70  mediastinal]
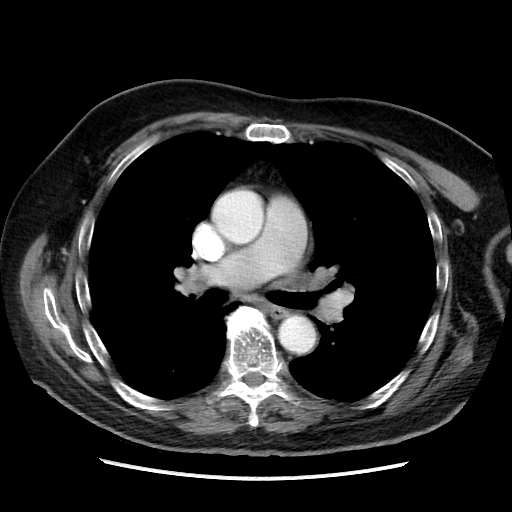
[im 44/70  lung]
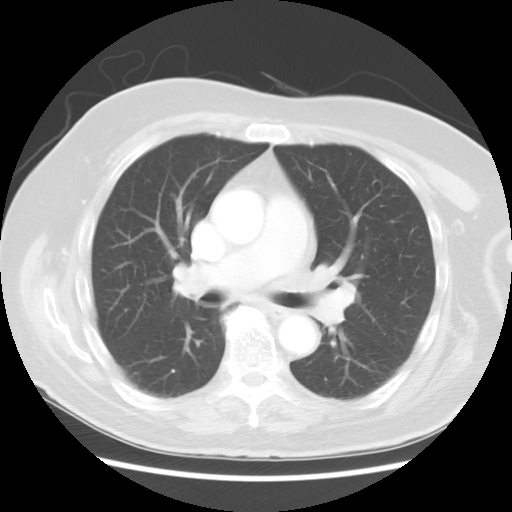
[im 52/70  lung]
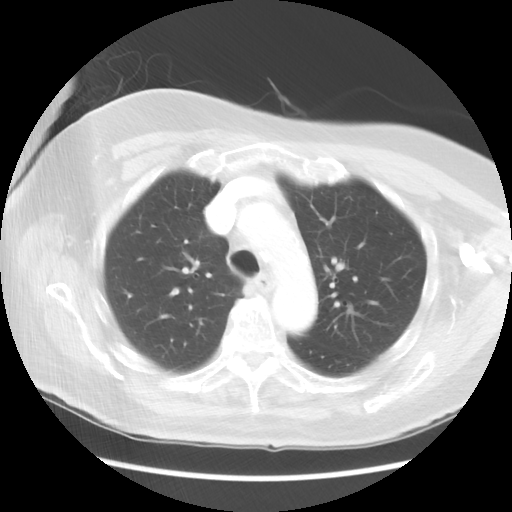
[im 61/70  lung]
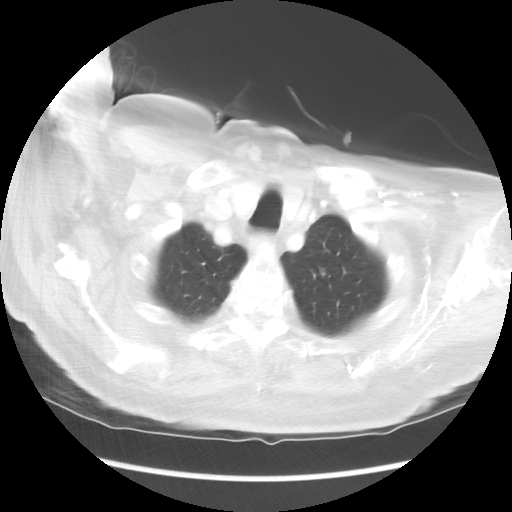

[Series 602: sagittal body · sagittal · 0.70mm/px · 8 of 145 slices shown]
[im 17/145  mediastinal]
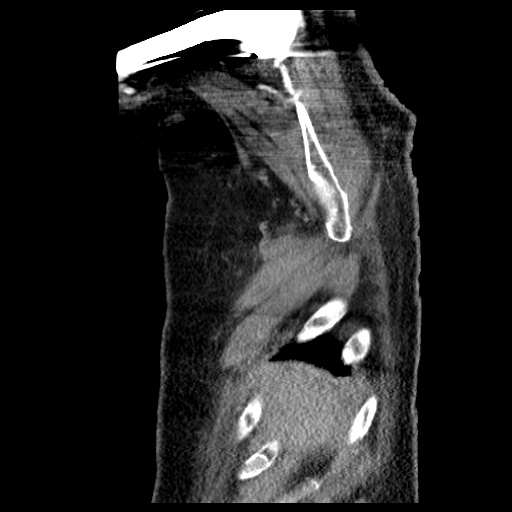
[im 33/145  mediastinal]
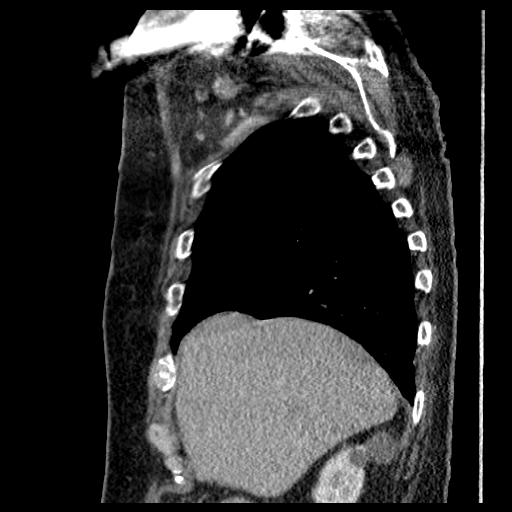
[im 49/145  mediastinal]
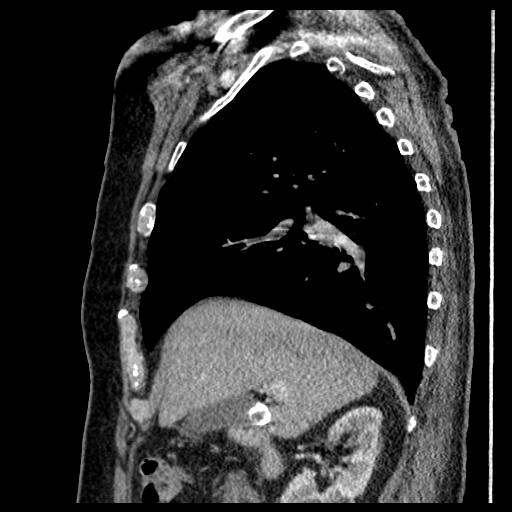
[im 65/145  mediastinal]
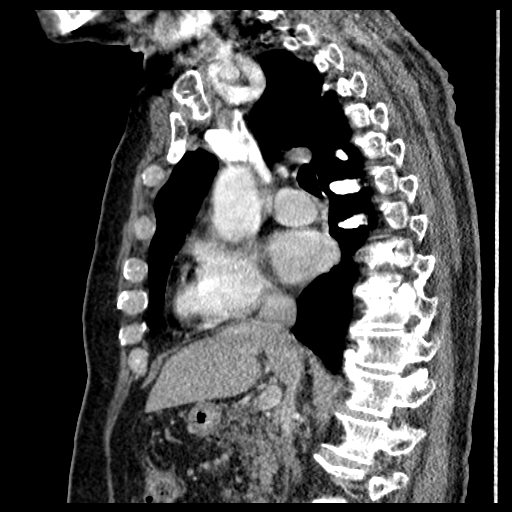
[im 81/145  mediastinal]
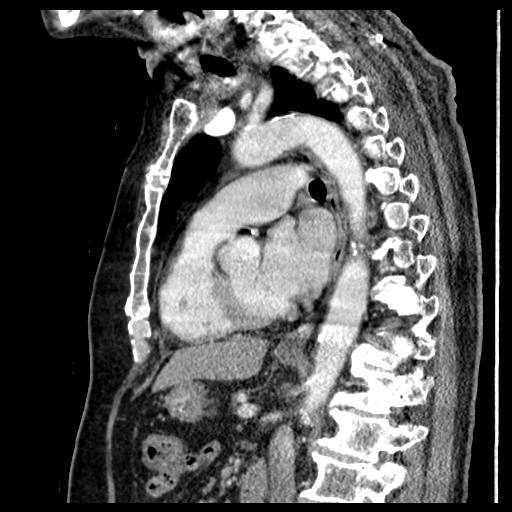
[im 97/145  mediastinal]
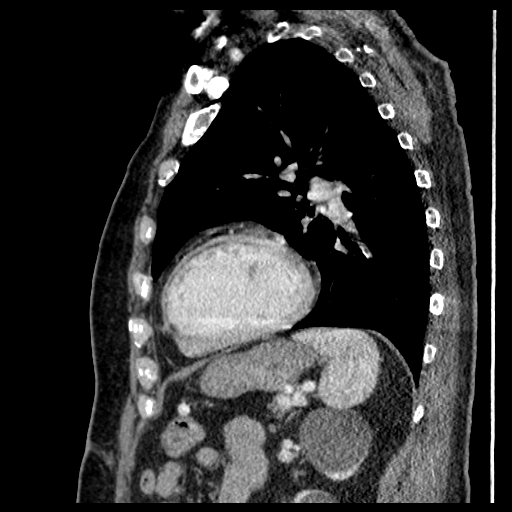
[im 113/145  mediastinal]
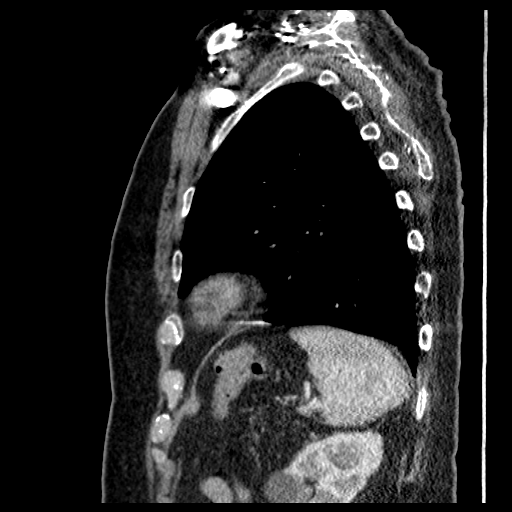
[im 129/145  mediastinal]
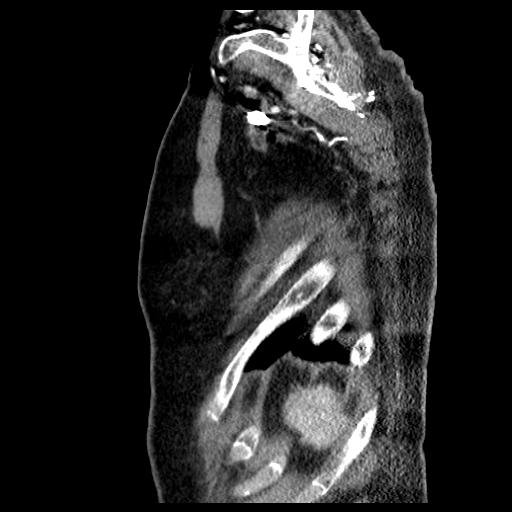

[15 of 30 positions shown; findings below may reference images not displayed]

FINDINGS: Atherosclerotic calcifications aorta and coronary arteries.

Beam hardening artifacts from RIGHT shoulder prosthesis.

Borderline dilated ascending thoracic aorta 3.9 x 3.8 cm image 29.

Calcified 17 mm gallstone in gallbladder.

Bowel wall thickening at ascending colon at site of prior colon
resection and anastomosis.

BILATERAL renal cysts and nonobstructing RIGHT renal calculi.

No thoracic adenopathy.

Questionable 4 mm nodule RIGHT middle lobe image 33.

7 mm focus of nodularity versus pleural thickening at minor fissure
image 42.

No additional pulmonary mass, nodule, infiltrate or pleural
effusion.

Osseous demineralization with scattered degenerative disc disease
changes of the thoracic spine.
IMPRESSION: Two questionable areas of nodularity in the RIGHT lung, recommend
attention on follow-up CT chest exams to assess stability and
determine significance.

Cholelithiasis.

Extensive atherosclerotic disease.

BILATERAL renal cysts and nonobstructing RIGHT renal calculi.
